# Patient Record
Sex: Male | Born: 1965 | Race: White | Hispanic: No | Smoking: Current some day smoker
Health system: Southern US, Community
[De-identification: ages and names within clinical notes are randomized; demographics above are authoritative.]

## PROBLEM LIST (undated history)

## (undated) ENCOUNTER — Emergency Department (HOSPITAL_COMMUNITY): Discharge status: Against Medical Advice | Payer: MEDICAID

## (undated) DIAGNOSIS — F1110 Opioid abuse, uncomplicated: Secondary | ICD-10-CM

## (undated) DIAGNOSIS — F32A Depression, unspecified: Secondary | ICD-10-CM

## (undated) DIAGNOSIS — F329 Major depressive disorder, single episode, unspecified: Secondary | ICD-10-CM

## (undated) DIAGNOSIS — G8929 Other chronic pain: Secondary | ICD-10-CM

## (undated) DIAGNOSIS — M549 Dorsalgia, unspecified: Secondary | ICD-10-CM

## (undated) DIAGNOSIS — B1920 Unspecified viral hepatitis C without hepatic coma: Secondary | ICD-10-CM

## (undated) HISTORY — PX: SALIVARY GLAND SURGERY: SHX768

## (undated) HISTORY — PX: BACK SURGERY: SHX140

## (undated) HISTORY — PX: SHOULDER SURGERY: SHX246

---

## 1997-07-11 ENCOUNTER — Ambulatory Visit (HOSPITAL_COMMUNITY): Admission: RE | Admit: 1997-07-11 | Discharge: 1997-07-11 | Payer: Self-pay | Admitting: Neurosurgery

## 2000-11-05 ENCOUNTER — Emergency Department (HOSPITAL_COMMUNITY): Admission: EM | Admit: 2000-11-05 | Discharge: 2000-11-05 | Payer: Self-pay | Admitting: Emergency Medicine

## 2001-06-06 ENCOUNTER — Emergency Department (HOSPITAL_COMMUNITY): Admission: EM | Admit: 2001-06-06 | Discharge: 2001-06-06 | Payer: Self-pay | Admitting: Emergency Medicine

## 2001-06-06 ENCOUNTER — Encounter: Payer: Self-pay | Admitting: Emergency Medicine

## 2001-06-07 ENCOUNTER — Emergency Department (HOSPITAL_COMMUNITY): Admission: EM | Admit: 2001-06-07 | Discharge: 2001-06-07 | Payer: Self-pay | Admitting: Emergency Medicine

## 2003-01-13 ENCOUNTER — Emergency Department (HOSPITAL_COMMUNITY): Admission: EM | Admit: 2003-01-13 | Discharge: 2003-01-13 | Payer: Self-pay | Admitting: Emergency Medicine

## 2003-01-13 ENCOUNTER — Encounter: Payer: Self-pay | Admitting: Emergency Medicine

## 2003-01-17 ENCOUNTER — Encounter: Payer: Self-pay | Admitting: Orthopedic Surgery

## 2003-01-17 ENCOUNTER — Ambulatory Visit (HOSPITAL_COMMUNITY): Admission: RE | Admit: 2003-01-17 | Discharge: 2003-01-17 | Payer: Self-pay | Admitting: Orthopedic Surgery

## 2003-03-08 ENCOUNTER — Emergency Department (HOSPITAL_COMMUNITY): Admission: EM | Admit: 2003-03-08 | Discharge: 2003-03-08 | Payer: Self-pay | Admitting: Emergency Medicine

## 2006-12-31 ENCOUNTER — Emergency Department (HOSPITAL_COMMUNITY): Admission: EM | Admit: 2006-12-31 | Discharge: 2006-12-31 | Payer: Self-pay | Admitting: Emergency Medicine

## 2007-01-20 ENCOUNTER — Ambulatory Visit: Payer: Self-pay | Admitting: Internal Medicine

## 2007-07-26 ENCOUNTER — Ambulatory Visit: Payer: Self-pay | Admitting: Internal Medicine

## 2007-08-04 ENCOUNTER — Ambulatory Visit: Payer: Self-pay | Admitting: Internal Medicine

## 2007-08-04 LAB — CONVERTED CEMR LAB
ALT: 92 units/L — ABNORMAL HIGH (ref 0–53)
AST: 32 units/L (ref 0–37)
Albumin: 4 g/dL (ref 3.5–5.2)
Alkaline Phosphatase: 88 units/L (ref 39–117)
BUN: 12 mg/dL (ref 6–23)
CO2: 22 meq/L (ref 19–32)
Calcium: 8.9 mg/dL (ref 8.4–10.5)
Chloride: 107 meq/L (ref 96–112)
Cholesterol: 202 mg/dL — ABNORMAL HIGH (ref 0–200)
Creatinine, Ser: 0.94 mg/dL (ref 0.40–1.50)
Glucose, Bld: 101 mg/dL — ABNORMAL HIGH (ref 70–99)
HDL: 24 mg/dL — ABNORMAL LOW (ref >39)
Potassium: 4.5 meq/L (ref 3.5–5.3)
Sodium: 141 meq/L (ref 135–145)
Total Bilirubin: 0.4 mg/dL (ref 0.3–1.2)
Total CHOL/HDL Ratio: 8.4
Total Protein: 7.7 g/dL (ref 6.0–8.3)
Triglycerides: 425 mg/dL — ABNORMAL HIGH (ref <150)

## 2007-08-05 ENCOUNTER — Ambulatory Visit: Payer: Self-pay | Admitting: *Deleted

## 2007-09-01 ENCOUNTER — Ambulatory Visit: Payer: Self-pay | Admitting: Internal Medicine

## 2007-09-01 LAB — CONVERTED CEMR LAB
HCV Ab: POSITIVE — AB
Hep A Total Ab: NEGATIVE
Hep B Core Total Ab: NEGATIVE
Hep B E Ab: NEGATIVE
Hep B S Ab: NEGATIVE
Hepatitis B Surface Ag: NEGATIVE

## 2007-09-19 ENCOUNTER — Ambulatory Visit: Payer: Self-pay | Admitting: Internal Medicine

## 2008-02-16 ENCOUNTER — Ambulatory Visit: Payer: Self-pay | Admitting: Internal Medicine

## 2009-07-05 ENCOUNTER — Ambulatory Visit: Payer: Self-pay | Admitting: Internal Medicine

## 2009-07-05 LAB — CONVERTED CEMR LAB
ALT: 245 units/L — ABNORMAL HIGH (ref 0–53)
AST: 90 units/L — ABNORMAL HIGH (ref 0–37)
Albumin: 4.6 g/dL (ref 3.5–5.2)
Alkaline Phosphatase: 83 units/L (ref 39–117)
BUN: 10 mg/dL (ref 6–23)
Basophils Absolute: 0.1 10*3/uL (ref 0.0–0.1)
Basophils Relative: 1 % (ref 0–1)
CO2: 25 meq/L (ref 19–32)
Calcium: 9.6 mg/dL (ref 8.4–10.5)
Chloride: 106 meq/L (ref 96–112)
Cholesterol: 212 mg/dL — ABNORMAL HIGH (ref 0–200)
Creatinine, Ser: 0.95 mg/dL (ref 0.40–1.50)
Direct LDL: 118 mg/dL — ABNORMAL HIGH
Eosinophils Absolute: 0.5 10*3/uL (ref 0.0–0.7)
Eosinophils Relative: 6 % — ABNORMAL HIGH (ref 0–5)
Glucose, Bld: 83 mg/dL (ref 70–99)
HCT: 49.5 % (ref 39.0–52.0)
HDL: 25 mg/dL — ABNORMAL LOW (ref >39)
Hemoglobin: 16.9 g/dL (ref 13.0–17.0)
Lymphocytes Relative: 26 % (ref 12–46)
Lymphs Abs: 2.2 10*3/uL (ref 0.7–4.0)
MCHC: 34.1 g/dL (ref 30.0–36.0)
MCV: 91.8 fL (ref 78.0–100.0)
Monocytes Absolute: 0.7 10*3/uL (ref 0.1–1.0)
Monocytes Relative: 8 % (ref 3–12)
Neutro Abs: 5.2 10*3/uL (ref 1.7–7.7)
Neutrophils Relative %: 60 % (ref 43–77)
Platelets: 191 10*3/uL (ref 150–400)
Potassium: 4.2 meq/L (ref 3.5–5.3)
RBC: 5.39 M/uL (ref 4.22–5.81)
RDW: 13.6 % (ref 11.5–15.5)
Sodium: 139 meq/L (ref 135–145)
Total Bilirubin: 0.5 mg/dL (ref 0.3–1.2)
Total CHOL/HDL Ratio: 8.5
Total Protein: 8 g/dL (ref 6.0–8.3)
Triglycerides: 428 mg/dL — ABNORMAL HIGH (ref <150)
WBC: 8.7 10*3/uL (ref 4.0–10.5)

## 2010-03-07 ENCOUNTER — Emergency Department (HOSPITAL_BASED_OUTPATIENT_CLINIC_OR_DEPARTMENT_OTHER)
Admission: EM | Admit: 2010-03-07 | Discharge: 2010-03-07 | Payer: Self-pay | Source: Home / Self Care | Admitting: Emergency Medicine

## 2010-06-20 ENCOUNTER — Emergency Department (HOSPITAL_BASED_OUTPATIENT_CLINIC_OR_DEPARTMENT_OTHER)
Admission: EM | Admit: 2010-06-20 | Discharge: 2010-06-20 | Disposition: A | Discharge status: Home / Self Care | Payer: Self-pay | Attending: Emergency Medicine | Admitting: Emergency Medicine

## 2010-06-20 DIAGNOSIS — R220 Localized swelling, mass and lump, head: Secondary | ICD-10-CM | POA: Insufficient documentation

## 2010-06-20 DIAGNOSIS — F17200 Nicotine dependence, unspecified, uncomplicated: Secondary | ICD-10-CM | POA: Insufficient documentation

## 2010-06-20 DIAGNOSIS — F1910 Other psychoactive substance abuse, uncomplicated: Secondary | ICD-10-CM | POA: Insufficient documentation

## 2010-06-20 DIAGNOSIS — T783XXA Angioneurotic edema, initial encounter: Secondary | ICD-10-CM | POA: Insufficient documentation

## 2010-06-20 DIAGNOSIS — X58XXXA Exposure to other specified factors, initial encounter: Secondary | ICD-10-CM | POA: Insufficient documentation

## 2010-06-20 LAB — COMPREHENSIVE METABOLIC PANEL
Albumin: 4.3 g/dL (ref 3.5–5.2)
Alkaline Phosphatase: 104 U/L (ref 39–117)
BUN: 8 mg/dL (ref 6–23)
Chloride: 110 mEq/L (ref 96–112)
Creatinine, Ser: 0.8 mg/dL (ref 0.4–1.5)
Glucose, Bld: 137 mg/dL — ABNORMAL HIGH (ref 70–99)
Potassium: 4 mEq/L (ref 3.5–5.1)
Total Bilirubin: 0.8 mg/dL (ref 0.3–1.2)

## 2010-06-20 LAB — CBC
HCT: 45 % (ref 39.0–52.0)
MCH: 30.9 pg (ref 26.0–34.0)
MCV: 85.7 fL (ref 78.0–100.0)
Platelets: 165 10*3/uL (ref 150–400)
RBC: 5.25 MIL/uL (ref 4.22–5.81)
RDW: 13 % (ref 11.5–15.5)
WBC: 11.1 10*3/uL — ABNORMAL HIGH (ref 4.0–10.5)

## 2010-06-20 LAB — DIFFERENTIAL
Basophils Absolute: 0 10*3/uL (ref 0.0–0.1)
Basophils Relative: 0 % (ref 0–1)
Eosinophils Absolute: 0.2 10*3/uL (ref 0.0–0.7)
Eosinophils Relative: 2 % (ref 0–5)
Lymphocytes Relative: 14 % (ref 12–46)
Lymphs Abs: 1.5 10*3/uL (ref 0.7–4.0)
Monocytes Absolute: 0.5 10*3/uL (ref 0.1–1.0)
Monocytes Relative: 4 % (ref 3–12)
Neutro Abs: 8.8 10*3/uL — ABNORMAL HIGH (ref 1.7–7.7)
Neutrophils Relative %: 80 % — ABNORMAL HIGH (ref 43–77)

## 2010-08-22 NOTE — Op Note (Signed)
   NAME:  Erik, [name]                           ACCOUNT NO.:  [id number]   MEDICAL RECORD NO.:  [id number]                   PATIENT TYPE:  OIB   LOCATION:  2550                                 FACILITY:  [facility]   PHYSICIAN:  Myrtie Neither, M.D.                 DATE OF BIRTH:  [birth year]   DATE OF PROCEDURE:  01/17/2003  DATE OF DISCHARGE:                                 OPERATIVE REPORT   PREOPERATIVE DIAGNOSIS:  Fractured fourth and fifth metacarpals right hand.   POSTOPERATIVE DIAGNOSIS:  Fractured fourth and fifth metacarpals right hand.   ANESTHESIA:  General.   PROCEDURE:  Closed manipulative reduction percutaneous pinning fourth and  fifth metacarpals right hand.   The patient was take to the operating room after given adequate preoperative  medication, given general anesthesia and intubated.  The right hand was  scrubbed with Betadine scrub and painted with Duraprep, draped in a sterile  manner.  The Bovie was used for hemostasis.  C-arm used to visualized  manipulative reduction.  A small incision was made over the fifth metacarpal  base, visualized with the C-arm.  Traction was applied to the fifth finger.  Reduction of the fifth metacarpal was done and it was pinned with a K wire.  The fourth metacarpal:  A small incision was made over the base of the  fourth metacarpal.  Again, manipulative reduction was done and stabilized  with a K wire.  Irrigation was then done.  Wound closure was done with 4-0  nylon.  Bulky compressive dressing was applied and bulky cast applied.  The  patient tolerated the procedure quite well, went to the recovery room in  stable and satisfactory condition.  The patient is being discharged home,  Percocet one to two q.4h. p.r.n. for pain, ice packs, elevation, and return  to the office in one week.  The patient is being discharged in stable and  satisfactory condition.                                               Myrtie Neither, M.D.    AC/MEDQ  D:  01/17/2003  T:  01/17/2003  Job:  [id number]
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
## 2010-09-15 ENCOUNTER — Emergency Department (INDEPENDENT_AMBULATORY_CARE_PROVIDER_SITE_OTHER): Payer: Self-pay

## 2010-09-15 ENCOUNTER — Emergency Department (HOSPITAL_BASED_OUTPATIENT_CLINIC_OR_DEPARTMENT_OTHER)
Admission: EM | Admit: 2010-09-15 | Discharge: 2010-09-15 | Disposition: A | Discharge status: Home / Self Care | Payer: Self-pay | Attending: Emergency Medicine | Admitting: Emergency Medicine

## 2010-09-15 DIAGNOSIS — R0602 Shortness of breath: Secondary | ICD-10-CM | POA: Insufficient documentation

## 2010-09-15 DIAGNOSIS — J069 Acute upper respiratory infection, unspecified: Secondary | ICD-10-CM | POA: Insufficient documentation

## 2010-09-15 DIAGNOSIS — J90 Pleural effusion, not elsewhere classified: Secondary | ICD-10-CM

## 2010-09-15 DIAGNOSIS — F17200 Nicotine dependence, unspecified, uncomplicated: Secondary | ICD-10-CM | POA: Insufficient documentation

## 2010-09-15 DIAGNOSIS — B1920 Unspecified viral hepatitis C without hepatic coma: Secondary | ICD-10-CM | POA: Insufficient documentation

## 2010-09-15 DIAGNOSIS — R079 Chest pain, unspecified: Secondary | ICD-10-CM | POA: Insufficient documentation

## 2010-09-15 DIAGNOSIS — I517 Cardiomegaly: Secondary | ICD-10-CM

## 2010-09-15 DIAGNOSIS — I319 Disease of pericardium, unspecified: Secondary | ICD-10-CM

## 2010-09-15 LAB — URINALYSIS, ROUTINE W REFLEX MICROSCOPIC
Glucose, UA: NEGATIVE mg/dL
Leukocytes, UA: NEGATIVE
Nitrite: NEGATIVE
Protein, ur: NEGATIVE mg/dL
pH: 6.5 (ref 5.0–8.0)

## 2010-09-15 LAB — TROPONIN I
Troponin I: <0.3 ng/mL (ref <0.30)
Troponin I: <0.3 ng/mL (ref <0.30)

## 2010-09-15 LAB — BASIC METABOLIC PANEL
CO2: 24 mEq/L (ref 19–32)
Chloride: 103 mEq/L (ref 96–112)
GFR calc Af Amer: >60 mL/min (ref >60)
Sodium: 138 mEq/L (ref 135–145)

## 2010-09-15 LAB — DIFFERENTIAL
Lymphocytes Relative: 15 % (ref 12–46)
Lymphs Abs: 1.5 10*3/uL (ref 0.7–4.0)
Monocytes Absolute: 0.8 10*3/uL (ref 0.1–1.0)
Monocytes Relative: 8 % (ref 3–12)
Neutro Abs: 7.3 10*3/uL (ref 1.7–7.7)

## 2010-09-15 LAB — CBC
HCT: 43 % (ref 39.0–52.0)
Hemoglobin: 14.9 g/dL (ref 13.0–17.0)
MCH: 29.9 pg (ref 26.0–34.0)
MCHC: 34.7 g/dL (ref 30.0–36.0)
MCV: 86.3 fL (ref 78.0–100.0)

## 2010-09-15 LAB — CK TOTAL AND CKMB (NOT AT ARMC): Total CK: 71 U/L (ref 7–232)

## 2010-09-15 MED ORDER — IOHEXOL 350 MG/ML SOLN
80.0000 mL | Freq: Once | INTRAVENOUS | Status: AC | PRN
  Administered 2010-09-15: 80 mL via INTRAVENOUS

## 2011-06-24 ENCOUNTER — Other Ambulatory Visit: Payer: Self-pay | Admitting: Family Medicine

## 2011-06-24 DIAGNOSIS — R748 Abnormal levels of other serum enzymes: Secondary | ICD-10-CM

## 2011-06-29 ENCOUNTER — Ambulatory Visit
Admission: RE | Admit: 2011-06-29 | Discharge: 2011-06-29 | Disposition: A | Discharge status: Home / Self Care | Payer: No Typology Code available for payment source | Source: Ambulatory Visit | Attending: Family Medicine | Admitting: Family Medicine

## 2011-06-29 DIAGNOSIS — R748 Abnormal levels of other serum enzymes: Secondary | ICD-10-CM

## 2011-09-30 ENCOUNTER — Emergency Department (HOSPITAL_BASED_OUTPATIENT_CLINIC_OR_DEPARTMENT_OTHER): Payer: Self-pay

## 2011-09-30 ENCOUNTER — Encounter (HOSPITAL_BASED_OUTPATIENT_CLINIC_OR_DEPARTMENT_OTHER): Payer: Self-pay | Admitting: Emergency Medicine

## 2011-09-30 ENCOUNTER — Emergency Department (HOSPITAL_BASED_OUTPATIENT_CLINIC_OR_DEPARTMENT_OTHER)
Admission: EM | Admit: 2011-09-30 | Discharge: 2011-09-30 | Disposition: A | Discharge status: Home / Self Care | Payer: Self-pay | Attending: Emergency Medicine | Admitting: Emergency Medicine

## 2011-09-30 DIAGNOSIS — S022XXA Fracture of nasal bones, initial encounter for closed fracture: Secondary | ICD-10-CM | POA: Insufficient documentation

## 2011-09-30 DIAGNOSIS — S02401A Maxillary fracture, unspecified, initial encounter for closed fracture: Secondary | ICD-10-CM | POA: Insufficient documentation

## 2011-09-30 DIAGNOSIS — S0120XA Unspecified open wound of nose, initial encounter: Secondary | ICD-10-CM | POA: Insufficient documentation

## 2011-09-30 DIAGNOSIS — S0230XA Fracture of orbital floor, unspecified side, initial encounter for closed fracture: Secondary | ICD-10-CM | POA: Insufficient documentation

## 2011-09-30 DIAGNOSIS — S0121XA Laceration without foreign body of nose, initial encounter: Secondary | ICD-10-CM

## 2011-09-30 MED ORDER — MORPHINE SULFATE 4 MG/ML IJ SOLN
6.0000 mg | Freq: Once | INTRAMUSCULAR | Status: AC
  Administered 2011-09-30: 6 mg via INTRAMUSCULAR
  Filled 2011-09-30: qty 1

## 2011-09-30 MED ORDER — MORPHINE SULFATE 2 MG/ML IJ SOLN
INTRAMUSCULAR | Status: AC
  Filled 2011-09-30: qty 1

## 2011-09-30 MED ORDER — LIDOCAINE-EPINEPHRINE 2 %-1:100000 IJ SOLN
30.0000 mL | Freq: Once | INTRAMUSCULAR | Status: AC
  Administered 2011-09-30: 30 mL
  Filled 2011-09-30: qty 1

## 2011-09-30 MED ORDER — OXYCODONE HCL 5 MG PO TABS: 5.0000 mg | ORAL_TABLET | ORAL | Status: AC | PRN

## 2011-09-30 MED ORDER — OXYCODONE-ACETAMINOPHEN 5-325 MG PO TABS
2.0000 | ORAL_TABLET | Freq: Once | ORAL | Status: AC
  Administered 2011-09-30: 2 via ORAL
  Filled 2011-09-30: qty 2
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
## 2011-09-30 NOTE — ED Notes (Signed)
Pt was brought to ED by father.

## 2011-09-30 NOTE — Discharge Instructions (Signed)
Facial Laceration  A facial laceration is a cut on the face. Lacerations usually heal quickly, but they need special care to reduce scarring. It will take 1 to 2 years for the scar to lose its redness and to heal completely.  TREATMENT   Some facial lacerations may not require closure. Some lacerations may not be able to be closed due to an increased risk of infection. It is important to see your caregiver as soon as possible after an injury to minimize the risk of infection and to maximize the opportunity for successful closure.  If closure is appropriate, pain medicines may be given, if needed. The wound will be cleaned to help prevent infection. Your caregiver will use stitches (sutures), staples, wound glue (adhesive), or skin adhesive strips to repair the laceration. These tools bring the skin edges together to allow for faster healing and a better cosmetic outcome. However, all wounds will heal with a scar.   Once the wound has healed, scarring can be minimized by covering the wound with sunscreen during the day for 1 full year. Use a sunscreen with an SPF of at least 30. Sunscreen helps to reduce the pigment that will form in the scar. When applying sunscreen to a completely healed wound, massage the scar for a few minutes to help reduce the appearance of the scar. Use circular motions with your fingertips, on and around the scar. Do not massage a healing wound.  HOME CARE INSTRUCTIONS  For sutures:   Keep the wound clean and dry.   If you were given a bandage (dressing), you should change it at least once a day. Also change the dressing if it becomes wet or dirty, or as directed by your caregiver.   Wash the wound with soap and water 2 times a day. Rinse the wound off with water to remove all soap. Pat the wound dry with a clean towel.   After cleaning, apply a thin layer of the antibiotic ointment recommended by your caregiver. This will help prevent infection and keep the dressing from sticking.   You  may shower as usual after the first 24 hours. Do not soak the wound in water until the sutures are removed.   Only take over-the-counter or prescription medicines for pain, discomfort, or fever as directed by your caregiver.   Get your sutures removed as directed by your caregiver. With facial lacerations, sutures should usually be taken out after 4 to 5 days to avoid stitch marks.   Wait a few days after your sutures are removed before applying makeup.  For skin adhesive strips:   Keep the wound clean and dry.   Do not get the skin adhesive strips wet. You may bathe carefully, using caution to keep the wound dry.   If the wound gets wet, pat it dry with a clean towel.   Skin adhesive strips will fall off on their own. You may trim the strips as the wound heals. Do not remove skin adhesive strips that are still stuck to the wound. They will fall off in time.  For wound adhesive:   You may briefly wet your wound in the shower or bath. Do not soak or scrub the wound. Do not swim. Avoid periods of heavy perspiration until the skin adhesive has fallen off on its own. After showering or bathing, gently pat the wound dry with a clean towel.   Do not apply liquid medicine, cream medicine, ointment medicine, or makeup to your wound while the   before your wound is healed.   If a dressing is placed over the wound, be careful not to apply tape directly over the skin adhesive. This may cause the adhesive to be pulled off before the wound is healed.   Avoid prolonged exposure to sunlight or tanning lamps while the skin adhesive is in place. Exposure to ultraviolet light in the first year will darken the scar.   The skin adhesive will usually remain in place for 5 to 10 days, then naturally fall off the skin. Do not pick at the adhesive film.  You may need a tetanus shot if:  You cannot remember when you had your last tetanus shot.   You have  never had a tetanus shot.  If you get a tetanus shot, your arm may swell, get red, and feel warm to the touch. This is common and not a problem. If you need a tetanus shot and you choose not to have one, there is a rare chance of getting tetanus. Sickness from tetanus can be serious. SEEK IMMEDIATE MEDICAL CARE IF:  You develop redness, pain, or swelling around the wound.   There is yellowish-white fluid (pus) coming from the wound.   You develop chills or a fever.  MAKE SURE YOU:  Understand these instructions.   Will watch your condition.   Will get help right away if you are not doing well or get worse.  Document Released: 04/30/2004 Document Revised: 03/12/2011 Document Reviewed: 09/15/2010 [facility] Patient Information 2012 [place], [place].Nasal Fracture A nasal fracture is a break or crack in the bones of the nose. A minor break usually heals in a month. You often will receive black eyes from a nasal fracture. This is not a cause for concern. The black eyes will go away over 1 to 2 weeks.  DIAGNOSIS  Your caregiver may want to examine you if you are concerned about a fracture of the nose. X-rays of the nose may not show a nasal fracture even when one is present. Sometimes your caregiver must wait 1 to 5 days after the injury to re-check the nose for alignment and to take additional X-rays. Sometimes the caregiver must wait until the swelling has gone down. TREATMENT Minor fractures that have caused no deformity often do not require treatment. More serious fractures where bones are displaced may require surgery. This will take place after the swelling is gone. Surgery will stabilize and align the fracture. HOME CARE INSTRUCTIONS   Put ice on the injured area.   Put ice in a plastic bag.   Place a towel between your skin and the bag.   Leave the ice on for 15 to 20 minutes, 3 to 4 times a day.   Take medications as directed by your caregiver.   Only take over-the-counter or  prescription medicines for pain, discomfort, or fever as directed by your caregiver.   If your nose starts bleeding, squeeze the soft parts of the nose against the center wall while you are sitting in an upright position for 10 minutes.   Contact sports should be avoided for at least 3 to 4 weeks or as directed by your caregiver.  SEEK MEDICAL CARE IF:  Your pain increases or becomes severe.   You continue to have nosebleeds.   The shape of your nose does not return to normal within 5 days.   You have pus draining from the nose.  SEEK IMMEDIATE MEDICAL CARE IF:   You have bleeding from your nose that  does not stop after 20 minutes of pinching the nostrils closed and keeping ice on the nose.   You have clear fluid draining from your nose.   You notice a grape-like swelling on the dividing wall between the nostrils (septum). This is a collection of blood (hematoma) that must be drained to help prevent infection.   You have difficulty moving your eyes.   You have recurrent vomiting.  Document Released: 03/20/2000 Document Revised: 03/12/2011 Document Reviewed: 07/07/2010 [facility] Patient Information 2012 [place], [place].Orbital Floor Fracture, Blowout The eye sits in the bony structure of the skull called the orbit. The upper and outside walls of the orbit are very thick and strong. These walls protect the eye if the head is struck from the top or side of the eye. However, the inside wall near the nose and the orbit floor are very thin and weak. The bony floor of the orbit also acts as the roof of the air-filled space (sinus) below the orbit. If the eye receives a direct blow from the front, all the tissues around the eye are briefly pressed together. This makes the orbital wall pressure very high. Since the weakest walls tend to give way first, the inside wall or the orbit floor may break. If the floor fractures, the tissues around the eye, including the muscle that is used to make the eye  look down, may become trapped within the fracture as the floor of the orbit "blows out" into the sinus below.  CAUSES  Orbital floor fractures are caused by direct (blunt) trauma to the region of the eye. SYMPTOMS  Assuming that there has been no injury to the eye itself, symptoms can include:  Puffiness (swelling) and bruising around the eye area (black eye).   A gurgling sound when pressure is placed on the eye area. This sound comes from air that has escaped from the sinus into the space around the eye (orbital emphysema).   Seeing two of everything - one object being higher than the other (vertical diplopia). This is the result of the muscle that moves the eye down being trapped within the fracture. Since it cannot relax, the eye is being held in a downward position relative to the other eye and cannot look up. Vertical diplopia from an orbital floor fracture is worse when looking up.   Pain around the eye when looking up.   One eye looks sunken compared to the other eye (enophthalmos).   Numbness of the cheek and upper gum on the same side of the face with the floor fracture. This is a result of nerve injury to these areas. This nerve runs in a groove along the bone of the orbital floor on its way to the cheek and upper gums.  DIAGNOSIS  The diagnosis of an orbital floor fracture is suspected during an eye exam by an ophthalmologist. It is confirmed by X-rays or CT scan of the eye region. TREATMENT   Orbital floor fractures are not usually treated until all of the swelling around the eye has gone away. This may take 1 or 2 weeks. Once the swelling has gone down, an ophthalmologist will see if if the muscle below the eye is still trapped within the fracture.   If there is no sign of a trapped muscle or vertical diplopia, treatment is not necessary.   If there is double vision only when looking up, a decision may be made to not do anything since most people do not spend a lot  of time  looking up. This may depend on the person's profession. For instance, a Nutritional therapist or electrician may spend a large part of their day looking up and would therefore need treatment.   If there is persistent vertical double vision even when looking straight ahead, the ophthalmologist may try to free the muscle in the office. If this is unsuccessful, surgery is often needed.  SEEK IMMEDIATE MEDICAL CARE IF:  You have had a blow to the region of your eyes and have:  A drop in vision in either eye.   Swelling and bruising around either eye.   One eye seems to be "sunken" compared to the other.   You see two of everything with both eyes open when looking in any direction.   The two images get further apart when looking in a certain direction - especially up.   You have numbness of the cheek and upper gums on the side of the injury.   You develop an unexplained oral temperature over 102 F (38.9 C), or as your caregiver suggests.  Document Released: 09/16/2000 Document Revised: 03/12/2011 Document Reviewed: 07/18/2007 [facility] Patient Information 2012 [place], [place].

## 2011-09-30 NOTE — ED Notes (Signed)
Patient transported to CT 
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
## 2012-01-07 ENCOUNTER — Other Ambulatory Visit: Payer: Self-pay | Admitting: Neurosurgery

## 2012-01-07 DIAGNOSIS — M5126 Other intervertebral disc displacement, lumbar region: Secondary | ICD-10-CM

## 2012-01-07 DIAGNOSIS — M48061 Spinal stenosis, lumbar region without neurogenic claudication: Secondary | ICD-10-CM

## 2012-01-12 ENCOUNTER — Other Ambulatory Visit: Payer: Self-pay | Admitting: Neurosurgery

## 2012-01-12 ENCOUNTER — Ambulatory Visit
Admission: RE | Admit: 2012-01-12 | Discharge: 2012-01-12 | Disposition: A | Discharge status: Home / Self Care | Payer: No Typology Code available for payment source | Source: Ambulatory Visit | Attending: Neurosurgery | Admitting: Neurosurgery

## 2012-01-12 VITALS — BP 154/97 | HR 70

## 2012-01-12 DIAGNOSIS — M5126 Other intervertebral disc displacement, lumbar region: Secondary | ICD-10-CM

## 2012-01-12 DIAGNOSIS — M48061 Spinal stenosis, lumbar region without neurogenic claudication: Secondary | ICD-10-CM

## 2012-01-12 MED ORDER — IOHEXOL 180 MG/ML  SOLN
1.0000 mL | Freq: Once | INTRAMUSCULAR | Status: AC | PRN
  Administered 2012-01-12: 1 mL via EPIDURAL

## 2012-01-12 MED ORDER — METHYLPREDNISOLONE ACETATE 40 MG/ML INJ SUSP (RADIOLOG
120.0000 mg | Freq: Once | INTRAMUSCULAR | Status: AC
  Administered 2012-01-12: 120 mg via EPIDURAL

## 2012-10-20 ENCOUNTER — Emergency Department (HOSPITAL_BASED_OUTPATIENT_CLINIC_OR_DEPARTMENT_OTHER): Payer: Self-pay

## 2012-10-20 ENCOUNTER — Emergency Department (HOSPITAL_BASED_OUTPATIENT_CLINIC_OR_DEPARTMENT_OTHER)
Admission: EM | Admit: 2012-10-20 | Discharge: 2012-10-20 | Disposition: A | Discharge status: Home / Self Care | Payer: Self-pay | Attending: Emergency Medicine | Admitting: Emergency Medicine

## 2012-10-20 ENCOUNTER — Encounter (HOSPITAL_BASED_OUTPATIENT_CLINIC_OR_DEPARTMENT_OTHER): Payer: Self-pay

## 2012-10-20 DIAGNOSIS — Z8659 Personal history of other mental and behavioral disorders: Secondary | ICD-10-CM | POA: Insufficient documentation

## 2012-10-20 DIAGNOSIS — S6991XA Unspecified injury of right wrist, hand and finger(s), initial encounter: Secondary | ICD-10-CM

## 2012-10-20 DIAGNOSIS — W268XXA Contact with other sharp object(s), not elsewhere classified, initial encounter: Secondary | ICD-10-CM | POA: Insufficient documentation

## 2012-10-20 DIAGNOSIS — M549 Dorsalgia, unspecified: Secondary | ICD-10-CM | POA: Insufficient documentation

## 2012-10-20 DIAGNOSIS — Y929 Unspecified place or not applicable: Secondary | ICD-10-CM | POA: Insufficient documentation

## 2012-10-20 DIAGNOSIS — S40919A Unspecified superficial injury of unspecified shoulder, initial encounter: Secondary | ICD-10-CM | POA: Insufficient documentation

## 2012-10-20 DIAGNOSIS — Y939 Activity, unspecified: Secondary | ICD-10-CM | POA: Insufficient documentation

## 2012-10-20 DIAGNOSIS — F17200 Nicotine dependence, unspecified, uncomplicated: Secondary | ICD-10-CM | POA: Insufficient documentation

## 2012-10-20 HISTORY — DX: Other chronic pain: G89.29

## 2012-10-20 HISTORY — DX: Major depressive disorder, single episode, unspecified: F32.9

## 2012-10-20 HISTORY — DX: Depression, unspecified: F32.A

## 2012-10-20 HISTORY — DX: Dorsalgia, unspecified: M54.9

## 2012-10-20 MED ORDER — SULFAMETHOXAZOLE-TRIMETHOPRIM 800-160 MG PO TABS: 1.0000 | ORAL_TABLET | Freq: Two times a day (BID) | ORAL | Status: AC

## 2012-10-20 MED ORDER — LIDOCAINE HCL 2 % IJ SOLN: 5.0000 mL | Freq: Once | INTRAMUSCULAR | Status: DC

## 2012-10-20 NOTE — ED Provider Notes (Signed)
Medical screening examination/treatment/procedure(s) were performed by non-physician practitioner and as supervising physician I was immediately available for consultation/collaboration.   Carlen Rebuck, MD 10/20/12 1557 

## 2012-10-20 NOTE — ED Provider Notes (Signed)
   History    CSN: [id number] Arrival date & time 10/20/12  1143  None    Chief Complaint  Patient presents with  . Foreign Body   (Consider location/radiation/quality/duration/timing/severity/associated sxs/prior Treatment) Patient is a 47 y.o. male presenting with foreign body. The history is provided by the patient. No language interpreter was used.  Foreign Body Location:  Skin Suspected object: fishhook. Pain severity:  Mild Duration:  14 hours Timing:  Constant Ineffective treatments:  Removal attempts with tweezers  Past Medical History  Diagnosis Date  . Chronic back pain   . Depression    Past Surgical History  Procedure Laterality Date  . Back surgery    . Shoulder surgery    . Salivary gland surgery     No family history on file. History  Substance Use Topics  . Smoking status: Current Some Day Smoker  . Smokeless tobacco: Not on file  . Alcohol Use: Yes     Comment: 2 beers weekly    Review of Systems  All other systems reviewed and are negative.    Allergies  Review of patient's allergies indicates no known allergies.  Home Medications  No current outpatient prescriptions on file. BP 129/85  Pulse 91  Temp(Src) 98.3 F (36.8 C) (Oral)  Resp 18  Ht 6' (1.829 m)  Wt 300 lb (136.079 kg)  BMI 40.68 kg/m2  SpO2 97% Physical Exam  Constitutional: He appears well-developed and well-nourished.  HENT:  Head: Normocephalic.  Musculoskeletal: He exhibits tenderness.  Fish hook end of right thumb  nv and ns intact  Neurological: He is alert.  Skin: Skin is warm.  Psychiatric: He has a normal mood and affect.    ED Course  FOREIGN BODY REMOVAL Date/Time: 10/20/2012 12:27 PM Performed by: Elson Areas Authorized by: Elson Areas Consent: Verbal consent obtained. Consent given by: patient Patient understanding: patient states understanding of the procedure being performed Patient identity confirmed: verbally with patient Time out:  Immediately prior to procedure a "time out" was called to verify the correct patient, procedure, equipment, support staff and site/side marked as required. Body area: skin Anesthesia: local infiltration Local anesthetic: lidocaine 2% without epinephrine Patient sedated: no 1 objects recovered. Objects recovered: fish hook Post-procedure assessment: foreign body removed Patient tolerance: Patient tolerated the procedure well with no immediate complications. Comments: Small incision over barb to remove    (including critical care time) Labs Reviewed - No data to display No results found. 1. Fish hook injury of finger of right hand     MDM  Tetanus up to date,   Rx for bactrim x 7 days  Elson Areas, PA-C 10/20/12 1232

## 2012-10-20 NOTE — ED Notes (Signed)
Fish hook in right thumb

## 2012-10-20 NOTE — ED Notes (Signed)
Patient transported to [facility] 

## 2013-03-24 ENCOUNTER — Encounter (HOSPITAL_BASED_OUTPATIENT_CLINIC_OR_DEPARTMENT_OTHER): Payer: Self-pay | Admitting: Emergency Medicine

## 2013-03-24 ENCOUNTER — Emergency Department (HOSPITAL_BASED_OUTPATIENT_CLINIC_OR_DEPARTMENT_OTHER): Payer: Self-pay

## 2013-03-24 ENCOUNTER — Emergency Department (HOSPITAL_BASED_OUTPATIENT_CLINIC_OR_DEPARTMENT_OTHER)
Admission: EM | Admit: 2013-03-24 | Discharge: 2013-03-24 | Disposition: A | Discharge status: Home / Self Care | Payer: Self-pay | Attending: Emergency Medicine | Admitting: Emergency Medicine

## 2013-03-24 DIAGNOSIS — R109 Unspecified abdominal pain: Secondary | ICD-10-CM

## 2013-03-24 DIAGNOSIS — F17200 Nicotine dependence, unspecified, uncomplicated: Secondary | ICD-10-CM | POA: Insufficient documentation

## 2013-03-24 DIAGNOSIS — Z8659 Personal history of other mental and behavioral disorders: Secondary | ICD-10-CM | POA: Insufficient documentation

## 2013-03-24 DIAGNOSIS — R1013 Epigastric pain: Secondary | ICD-10-CM | POA: Insufficient documentation

## 2013-03-24 DIAGNOSIS — G8929 Other chronic pain: Secondary | ICD-10-CM | POA: Insufficient documentation

## 2013-03-24 DIAGNOSIS — R1012 Left upper quadrant pain: Secondary | ICD-10-CM | POA: Insufficient documentation

## 2013-03-24 LAB — COMPREHENSIVE METABOLIC PANEL
AST: 30 U/L (ref 0–37)
Albumin: 4.1 g/dL (ref 3.5–5.2)
BUN: 10 mg/dL (ref 6–23)
Chloride: 101 mEq/L (ref 96–112)
Creatinine, Ser: 0.8 mg/dL (ref 0.50–1.35)
Potassium: 3.7 mEq/L (ref 3.5–5.1)
Total Bilirubin: 0.4 mg/dL (ref 0.3–1.2)
Total Protein: 8.2 g/dL (ref 6.0–8.3)

## 2013-03-24 LAB — CBC WITH DIFFERENTIAL/PLATELET
Basophils Absolute: 0 10*3/uL (ref 0.0–0.1)
Basophils Relative: 0 % (ref 0–1)
Eosinophils Absolute: 0.1 10*3/uL (ref 0.0–0.7)
MCH: 29.7 pg (ref 26.0–34.0)
MCHC: 34.8 g/dL (ref 30.0–36.0)
Monocytes Absolute: 0.6 10*3/uL (ref 0.1–1.0)
Monocytes Relative: 4 % (ref 3–12)
Neutro Abs: 14.3 10*3/uL — ABNORMAL HIGH (ref 1.7–7.7)
Neutrophils Relative %: 85 % — ABNORMAL HIGH (ref 43–77)
RDW: 12.8 % (ref 11.5–15.5)

## 2013-03-24 LAB — LIPASE, BLOOD: Lipase: 15 U/L (ref 11–59)

## 2013-03-24 MED ORDER — RANITIDINE HCL 150 MG PO TABS: 150.0000 mg | ORAL_TABLET | Freq: Two times a day (BID) | ORAL | Status: DC

## 2013-03-24 MED ORDER — SODIUM CHLORIDE 0.9 % IV SOLN
INTRAVENOUS | Status: DC
  Administered 2013-03-24: 150 mL/h via INTRAVENOUS

## 2013-03-24 MED ORDER — SUCRALFATE 1 GM/10ML PO SUSP: 1.0000 g | Freq: Three times a day (TID) | ORAL | Status: DC

## 2013-03-24 MED ORDER — HYDROMORPHONE HCL PF 1 MG/ML IJ SOLN
1.0000 mg | Freq: Once | INTRAMUSCULAR | Status: AC
  Administered 2013-03-24: 1 mg via INTRAVENOUS
  Filled 2013-03-24: qty 1

## 2013-03-24 MED ORDER — ONDANSETRON 8 MG PO TBDP
8.0000 mg | ORAL_TABLET | Freq: Once | ORAL | Status: AC
  Administered 2013-03-24: 8 mg via ORAL
  Filled 2013-03-24: qty 1

## 2013-03-24 NOTE — ED Notes (Signed)
Pt reports that he developed acute onset of abdominal pain last pm that has gotten progressively worse during night, no emesis or diarrhea
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
## 2013-03-24 NOTE — ED Notes (Signed)
Pt to [place] monitored by this rn.

## 2013-03-24 NOTE — ED Notes (Signed)
Pt transported to xray, monitored by this rn. Sinus brady noted on monitor, rate of 40-47.

## 2013-03-24 NOTE — ED Provider Notes (Signed)
CSN: [id number]     Arrival date & time 03/24/13  8295 History   First MD Initiated Contact with Patient 03/24/13 0700     Chief Complaint  Patient presents with  . Abdominal Pain   (Consider location/radiation/quality/duration/timing/severity/associated sxs/prior Treatment) HPI Comments: Patient presents to the ER for evaluation of abdominal pain. He reports that he has severe, constant, upper abdominal pain that began yesterday afternoon. He has not had any nausea, vomiting or diarrhea. Patient denies pain into the chest and there is no shortness of breath. He has not had fever.  Patient does report that he has similar episode a week ago, but the pain went away on its own. He never saw a doctor for that. He has not had any symptoms prior to that.  Patient is a 47 y.o. male presenting with abdominal pain.  Abdominal Pain   Past Medical History  Diagnosis Date  . Chronic back pain   . Depression    Past Surgical History  Procedure Laterality Date  . Back surgery    . Shoulder surgery    . Salivary gland surgery     History reviewed. No pertinent family history. History  Substance Use Topics  . Smoking status: Current Some Day Smoker -- 1.00 packs/day    Types: Cigarettes  . Smokeless tobacco: Not on file  . Alcohol Use: Yes     Comment: 2 beers weekly    Review of Systems  Gastrointestinal: Positive for abdominal pain.  All other systems reviewed and are negative.    Allergies  Review of patient's allergies indicates no known allergies.  Home Medications  No current outpatient prescriptions on file. BP 150/96  Pulse 53  Temp(Src) 97.7 F (36.5 C) (Oral)  Resp 18  Ht 6' (1.829 m)  Wt 275 lb (124.739 kg)  BMI 37.29 kg/m2  SpO2 96% Physical Exam  Constitutional: He is oriented to person, place, and time. He appears well-developed and well-nourished. He appears distressed.  HENT:  Head: Normocephalic and atraumatic.  Right Ear: Hearing normal.  Left Ear:  Hearing normal.  Nose: Nose normal.  Mouth/Throat: Oropharynx is clear and moist and mucous membranes are normal.  Eyes: Conjunctivae and EOM are normal. Pupils are equal, round, and reactive to light.  Neck: Normal range of motion. Neck supple.  Cardiovascular: Regular rhythm, S1 normal and S2 normal.  Exam reveals no gallop and no friction rub.   No murmur heard. Pulmonary/Chest: Effort normal and breath sounds normal. No respiratory distress. He exhibits no tenderness.  Abdominal: Soft. Normal appearance and bowel sounds are normal. There is no hepatosplenomegaly. There is tenderness in the right upper quadrant, epigastric area and left upper quadrant. There is no rebound, no guarding, no tenderness at McBurney's point and negative Murphy's sign. No hernia.  Maximal tenderness is mid-epigastric  Musculoskeletal: Normal range of motion.  Neurological: He is alert and oriented to person, place, and time. He has normal strength. No cranial nerve deficit or sensory deficit. Coordination normal. GCS eye subscore is 4. GCS verbal subscore is 5. GCS motor subscore is 6.  Skin: Skin is warm, dry and intact. No rash noted. No cyanosis.  Psychiatric: He has a normal mood and affect. His speech is normal and behavior is normal. Thought content normal.    ED Course  Procedures (including critical care time) Labs Review Labs Reviewed  CBC WITH DIFFERENTIAL - Abnormal; Notable for the following:    WBC 16.8 (*)    Neutrophils Relative % 85 (*)  Neutro Abs 14.3 (*)    Lymphocytes Relative 10 (*)    All other components within normal limits  COMPREHENSIVE METABOLIC PANEL - Abnormal; Notable for the following:    Glucose, Bld 163 (*)    All other components within normal limits  LIPASE, BLOOD   Imaging Review US Abdomen Complete  03/24/2013   CLINICAL DATA:  Severe epigastric discomfort with nausea and vomiting the patient has a history of hepatitis-C and gallstones.  EXAM: ULTRASOUND ABDOMEN  COMPLETE  COMPARISON:  None.  FINDINGS: Gallbladder:  Evaluation of the gallbladder is limited by bowel gas. However, the gallbladder appears adequately distended. There is echogenic material within the gallbladder lumen consistent with sludge or noncalcified stones. The gallbladder wall measures 3 mm which is top-normal. There is no pericholecystic fluid but there is a positive sonographic Murphy's sign.  Common bile duct:  Diameter: 4 mm.  Liver:  The echotexture of the liver is increased diffusely suggesting fatty infiltration. There is no focal mass nor ductal dilation.  IVC:  Evaluation of the inferior vena cava is limited by bowel gas.  Pancreas: Bowel gas limits evaluation of the pancreas. No definite lesion in the visualized portion of the pancreatic body is demonstrated.  Spleen:  The spleen is top-normal in size measuring 12.2 cm in greatest dimension.  Right Kidney:  Length: 11.4. Echogenicity within normal limits. No mass or hydronephrosis visualized.  Left Kidney:  Length: 12.1. Echogenicity within normal limits. No mass or hydronephrosis visualized.  Abdominal aorta:  Evaluation of the abdominal aorta is limited by bowel gas. The maximal measured diameter is 3.1 cm in the proximal portion.  Other findings:  No ascites is demonstrated.  IMPRESSION: 1. The appearance of the gallbladder and the presence of a positive sonographic Murphy's sign may reflect acute cholecystitis. There is sludge present but discrete stones are not demonstrated. 2. The common bile duct, liver, and visualized portions of the pancreas appear normal. 3. The spleen is top-normal in size. The kidneys exhibit no acute abnormalities.   Electronically Signed   By: David  [place]   On: 03/24/2013 08:46   Dg Abd Acute W/chest  03/24/2013   CLINICAL DATA:  Abdominal pain, shortness of breath  EXAM: ACUTE ABDOMEN SERIES (ABDOMEN 2 VIEW & CHEST 1 VIEW)  COMPARISON:  09/15/2010  FINDINGS: Normal heart size without CHF or pneumonia.  Chronic central bronchitic change. No effusion or pneumothorax.  Negative for free air. Scattered air and stool throughout the colon. Degenerative changes of the hip joints, worse on the left. Mild SI joint arthropathy. Pelvic calcifications consistent with venous phleboliths. Degenerative changes of the spine, most pronounced at L3-4.  IMPRESSION: Chronic central bronchitic changes in the chest. No interval change.  Negative for obstruction or free air.   Electronically Signed   By: Ruel Favors M.D.   On: 03/24/2013 07:53    EKG Interpretation    Date/Time:  Friday March 24 2013 07:24:05 EST Ventricular Rate:  45 PR Interval:  158 QRS Duration: 106 QT Interval:  496 QTC Calculation: 429 R Axis:   46 Text Interpretation:  Sinus bradycardia Otherwise normal ECG Since last tracing rate slower Confirmed by POLLINA  MD, CHRISTOPHER ([id number]) on 03/24/2013 7:40:53 AM            MDM  Diagnosis: Abdominal pain  Presents to the ER for evaluation of significant abdominal pain. Patient is in distress on arrival with pain in the center of his upper abdomen in the mid epigastric region. He  was diffusely tender across the upper abdomen, but no focal tenderness in the right upper quadrant or obvious Murphy sign. Lab work was unremarkable other than leukocytosis. Presentation was suspicious for possible gastritis, peptic ulcer disease, the gallbladder was also considered. Ultrasound shows sludge in the gallbladder, cannot rule out cholecystitis, but at this point I do not feel the patient needs emergent surgical consultation. He has significantly improved, now pain-free after treatment with Dilantin. Patient given gallbladder disease diet instructions and will followup at surgery. Return if symptoms worsen. Patient will be treated with ranitidine and Carafate.    Gilda Crease, MD 03/24/13 [phone]

## 2013-08-12 ENCOUNTER — Encounter (HOSPITAL_COMMUNITY): Admission: EM | Disposition: A | Discharge status: Home / Self Care | Payer: Self-pay | Source: Home / Self Care

## 2013-08-12 ENCOUNTER — Emergency Department (HOSPITAL_BASED_OUTPATIENT_CLINIC_OR_DEPARTMENT_OTHER): Payer: Self-pay

## 2013-08-12 ENCOUNTER — Encounter (HOSPITAL_BASED_OUTPATIENT_CLINIC_OR_DEPARTMENT_OTHER): Payer: Self-pay | Admitting: Emergency Medicine

## 2013-08-12 ENCOUNTER — Inpatient Hospital Stay (HOSPITAL_BASED_OUTPATIENT_CLINIC_OR_DEPARTMENT_OTHER)
Admission: EM | Admit: 2013-08-12 | Discharge: 2013-08-16 | DRG: 446 | Disposition: A | Discharge status: Home / Self Care | Payer: Self-pay | Attending: Surgery | Admitting: Surgery

## 2013-08-12 DIAGNOSIS — F329 Major depressive disorder, single episode, unspecified: Secondary | ICD-10-CM | POA: Diagnosis present

## 2013-08-12 DIAGNOSIS — K812 Acute cholecystitis with chronic cholecystitis: Secondary | ICD-10-CM

## 2013-08-12 DIAGNOSIS — F17200 Nicotine dependence, unspecified, uncomplicated: Secondary | ICD-10-CM | POA: Diagnosis present

## 2013-08-12 DIAGNOSIS — Z8619 Personal history of other infectious and parasitic diseases: Secondary | ICD-10-CM

## 2013-08-12 DIAGNOSIS — K8010 Calculus of gallbladder with chronic cholecystitis without obstruction: Secondary | ICD-10-CM | POA: Diagnosis present

## 2013-08-12 DIAGNOSIS — F3289 Other specified depressive episodes: Secondary | ICD-10-CM | POA: Diagnosis present

## 2013-08-12 DIAGNOSIS — R1011 Right upper quadrant pain: Secondary | ICD-10-CM

## 2013-08-12 DIAGNOSIS — K8000 Calculus of gallbladder with acute cholecystitis without obstruction: Principal | ICD-10-CM | POA: Diagnosis present

## 2013-08-12 DIAGNOSIS — Z538 Procedure and treatment not carried out for other reasons: Secondary | ICD-10-CM

## 2013-08-12 DIAGNOSIS — G8929 Other chronic pain: Secondary | ICD-10-CM | POA: Diagnosis present

## 2013-08-12 DIAGNOSIS — Z6837 Body mass index (BMI) 37.0-37.9, adult: Secondary | ICD-10-CM

## 2013-08-12 DIAGNOSIS — M549 Dorsalgia, unspecified: Secondary | ICD-10-CM | POA: Diagnosis present

## 2013-08-12 HISTORY — DX: Unspecified viral hepatitis C without hepatic coma: B19.20

## 2013-08-12 LAB — CBC WITH DIFFERENTIAL/PLATELET
BASOS ABS: 0 10*3/uL (ref 0.0–0.1)
BASOS PCT: 0 % (ref 0–1)
EOS ABS: 0.1 10*3/uL (ref 0.0–0.7)
Eosinophils Relative: 0 % (ref 0–5)
HCT: 48.7 % (ref 39.0–52.0)
Hemoglobin: 17.1 g/dL — ABNORMAL HIGH (ref 13.0–17.0)
Lymphocytes Relative: 12 % (ref 12–46)
Lymphs Abs: 2 10*3/uL (ref 0.7–4.0)
MCH: 30.8 pg (ref 26.0–34.0)
MCHC: 35.1 g/dL (ref 30.0–36.0)
MCV: 87.6 fL (ref 78.0–100.0)
Monocytes Absolute: 1 10*3/uL (ref 0.1–1.0)
Monocytes Relative: 6 % (ref 3–12)
NEUTROS PCT: 81 % — AB (ref 43–77)
Neutro Abs: 13.3 10*3/uL — ABNORMAL HIGH (ref 1.7–7.7)
PLATELETS: 201 10*3/uL (ref 150–400)
RBC: 5.56 MIL/uL (ref 4.22–5.81)
RDW: 13.4 % (ref 11.5–15.5)
WBC: 16.4 10*3/uL — ABNORMAL HIGH (ref 4.0–10.5)

## 2013-08-12 LAB — COMPREHENSIVE METABOLIC PANEL
ALBUMIN: 4.1 g/dL (ref 3.5–5.2)
ALK PHOS: 86 U/L (ref 39–117)
ALT: 60 U/L — AB (ref 0–53)
AST: 27 U/L (ref 0–37)
BUN: 12 mg/dL (ref 6–23)
CO2: 24 mEq/L (ref 19–32)
Calcium: 9.9 mg/dL (ref 8.4–10.5)
Chloride: 102 mEq/L (ref 96–112)
Creatinine, Ser: 0.9 mg/dL (ref 0.50–1.35)
GFR calc Af Amer: >90 mL/min (ref >90)
GFR calc non Af Amer: >90 mL/min (ref >90)
Glucose, Bld: 148 mg/dL — ABNORMAL HIGH (ref 70–99)
POTASSIUM: 4 meq/L (ref 3.7–5.3)
SODIUM: 142 meq/L (ref 137–147)
TOTAL PROTEIN: 8.2 g/dL (ref 6.0–8.3)
Total Bilirubin: 1 mg/dL (ref 0.3–1.2)

## 2013-08-12 LAB — CBC
HEMATOCRIT: 42 % (ref 39.0–52.0)
HEMOGLOBIN: 14.7 g/dL (ref 13.0–17.0)
MCH: 30.5 pg (ref 26.0–34.0)
MCHC: 35 g/dL (ref 30.0–36.0)
MCV: 87.1 fL (ref 78.0–100.0)
Platelets: 143 10*3/uL — ABNORMAL LOW (ref 150–400)
RBC: 4.82 MIL/uL (ref 4.22–5.81)
RDW: 13 % (ref 11.5–15.5)
WBC: 16 10*3/uL — ABNORMAL HIGH (ref 4.0–10.5)

## 2013-08-12 LAB — CREATININE, SERUM
CREATININE: 0.74 mg/dL (ref 0.50–1.35)
GFR calc Af Amer: >90 mL/min (ref >90)
GFR calc non Af Amer: >90 mL/min (ref >90)

## 2013-08-12 LAB — LIPASE, BLOOD: Lipase: 14 U/L (ref 11–59)

## 2013-08-12 SURGERY — LAPAROSCOPIC CHOLECYSTECTOMY WITH INTRAOPERATIVE CHOLANGIOGRAM
Anesthesia: General

## 2013-08-12 MED ORDER — HYDROMORPHONE HCL PF 1 MG/ML IJ SOLN
1.0000 mg | INTRAMUSCULAR | Status: DC | PRN
  Administered 2013-08-12 (×2): 1 mg via INTRAVENOUS
  Filled 2013-08-12: qty 1

## 2013-08-12 MED ORDER — HEPARIN SODIUM (PORCINE) 5000 UNIT/ML IJ SOLN
5000.0000 [IU] | Freq: Three times a day (TID) | INTRAMUSCULAR | Status: DC
  Administered 2013-08-12 – 2013-08-16 (×10): 5000 [IU] via SUBCUTANEOUS
  Filled 2013-08-12 (×14): qty 1

## 2013-08-12 MED ORDER — MORPHINE SULFATE 4 MG/ML IJ SOLN
4.0000 mg | Freq: Once | INTRAMUSCULAR | Status: AC
  Administered 2013-08-12: 4 mg via INTRAVENOUS

## 2013-08-12 MED ORDER — SODIUM CHLORIDE 0.9 % IV BOLUS (SEPSIS)
1000.0000 mL | Freq: Once | INTRAVENOUS | Status: AC
Start: 2013-08-12 — End: 2013-08-12
  Administered 2013-08-12: 1000 mL via INTRAVENOUS

## 2013-08-12 MED ORDER — HYDROMORPHONE HCL PF 1 MG/ML IJ SOLN
1.0000 mg | Freq: Once | INTRAMUSCULAR | Status: AC
  Administered 2013-08-12: 1 mg via INTRAVENOUS
  Filled 2013-08-12: qty 1

## 2013-08-12 MED ORDER — ONDANSETRON HCL 4 MG/2ML IJ SOLN
INTRAMUSCULAR | Status: AC
  Filled 2013-08-12: qty 2

## 2013-08-12 MED ORDER — SODIUM CHLORIDE 0.9 % IJ SOLN
9.0000 mL | INTRAMUSCULAR | Status: DC | PRN
  Administered 2013-08-14: 9 mL via INTRAVENOUS

## 2013-08-12 MED ORDER — ONDANSETRON HCL 4 MG/2ML IJ SOLN: 4.0000 mg | Freq: Four times a day (QID) | INTRAMUSCULAR | Status: DC | PRN

## 2013-08-12 MED ORDER — HYDROMORPHONE HCL PF 1 MG/ML IJ SOLN
INTRAMUSCULAR | Status: AC
  Filled 2013-08-12: qty 1

## 2013-08-12 MED ORDER — NALOXONE HCL 0.4 MG/ML IJ SOLN: 0.4000 mg | INTRAMUSCULAR | Status: DC | PRN

## 2013-08-12 MED ORDER — HYDROCODONE-ACETAMINOPHEN 5-325 MG PO TABS
1.0000 | ORAL_TABLET | ORAL | Status: DC | PRN
  Administered 2013-08-12: 2 via ORAL
  Filled 2013-08-12: qty 2

## 2013-08-12 MED ORDER — DIPHENHYDRAMINE HCL 12.5 MG/5ML PO ELIX: 12.5000 mg | ORAL_SOLUTION | Freq: Four times a day (QID) | ORAL | Status: DC | PRN

## 2013-08-12 MED ORDER — SODIUM CHLORIDE 0.9 % IV BOLUS (SEPSIS)
1000.0000 mL | Freq: Once | INTRAVENOUS | Status: AC
  Administered 2013-08-12: 1000 mL via INTRAVENOUS

## 2013-08-12 MED ORDER — PANTOPRAZOLE SODIUM 40 MG IV SOLR
40.0000 mg | Freq: Every day | INTRAVENOUS | Status: DC
  Administered 2013-08-12 – 2013-08-15 (×4): 40 mg via INTRAVENOUS
  Filled 2013-08-12 (×5): qty 40

## 2013-08-12 MED ORDER — PIPERACILLIN-TAZOBACTAM 3.375 G IVPB 30 MIN
3.3750 g | Freq: Once | INTRAVENOUS | Status: AC
  Administered 2013-08-12: 3.375 g via INTRAVENOUS
  Filled 2013-08-12 (×2): qty 50

## 2013-08-12 MED ORDER — MORPHINE SULFATE 4 MG/ML IJ SOLN
INTRAMUSCULAR | Status: AC
  Filled 2013-08-12: qty 1

## 2013-08-12 MED ORDER — HYDROMORPHONE HCL PF 1 MG/ML IJ SOLN
1.0000 mg | Freq: Once | INTRAMUSCULAR | Status: AC
Start: 2013-08-12 — End: 2013-08-12
  Administered 2013-08-12: 1 mg via INTRAVENOUS
  Filled 2013-08-12: qty 1

## 2013-08-12 MED ORDER — KCL IN DEXTROSE-NACL 20-5-0.45 MEQ/L-%-% IV SOLN
INTRAVENOUS | Status: DC
  Administered 2013-08-12 – 2013-08-15 (×8): via INTRAVENOUS
  Filled 2013-08-12 (×16): qty 1000

## 2013-08-12 MED ORDER — DIPHENHYDRAMINE HCL 50 MG/ML IJ SOLN: 12.5000 mg | Freq: Four times a day (QID) | INTRAMUSCULAR | Status: DC | PRN

## 2013-08-12 MED ORDER — HYDROMORPHONE 0.3 MG/ML IV SOLN
INTRAVENOUS | Status: DC
  Administered 2013-08-12: 14:00:00 via INTRAVENOUS
  Administered 2013-08-12: 4.8 mg via INTRAVENOUS
  Administered 2013-08-12: 5 mg via INTRAVENOUS
  Administered 2013-08-13: 4.464 mg via INTRAVENOUS
  Administered 2013-08-13: 16:00:00 via INTRAVENOUS
  Administered 2013-08-13: 4.5 mg via INTRAVENOUS
  Administered 2013-08-13: 03:00:00 via INTRAVENOUS
  Administered 2013-08-13: 3.3 mg via INTRAVENOUS
  Administered 2013-08-13: 14.2 mg via INTRAVENOUS
  Administered 2013-08-13: 16.2 mg via INTRAVENOUS
  Administered 2013-08-13: 3.9 mg via INTRAVENOUS
  Administered 2013-08-13: 3.22 mg via INTRAVENOUS
  Administered 2013-08-13: 25 mg via INTRAVENOUS
  Administered 2013-08-13: 22:00:00 via INTRAVENOUS
  Administered 2013-08-14: 2.7 mg via INTRAVENOUS
  Administered 2013-08-14: 20:00:00 via INTRAVENOUS
  Administered 2013-08-14: 4.5 mg via INTRAVENOUS
  Administered 2013-08-14: 5.4 mg via INTRAVENOUS
  Administered 2013-08-14 (×2): via INTRAVENOUS
  Administered 2013-08-14: 4.1 mg via INTRAVENOUS
  Administered 2013-08-15: 1.8 mg via INTRAVENOUS
  Administered 2013-08-15: 4.8 mg via INTRAVENOUS
  Administered 2013-08-15: 0.458 mg via INTRAVENOUS
  Administered 2013-08-15: 2.4 mg via INTRAVENOUS
  Filled 2013-08-12 (×10): qty 25

## 2013-08-12 MED ORDER — ONDANSETRON HCL 4 MG/2ML IJ SOLN
4.0000 mg | Freq: Once | INTRAMUSCULAR | Status: AC
  Administered 2013-08-12: 4 mg via INTRAVENOUS

## 2013-08-12 MED ORDER — PIPERACILLIN-TAZOBACTAM 3.375 G IVPB
3.3750 g | Freq: Three times a day (TID) | INTRAVENOUS | Status: DC
Start: 2013-08-12 — End: 2013-08-16
  Administered 2013-08-12 – 2013-08-16 (×12): 3.375 g via INTRAVENOUS
  Filled 2013-08-12 (×15): qty 50

## 2013-08-12 NOTE — H&P (Signed)
Chief Complaint:  Right upper quadrant pain for 4 days  History of Present Illness:  Erik [name] is an 48 y.o. male transferred [facility] with a four-day history of right upper quadrant pain. He previously had a gallbladder ultrasound back last December which showed gallstones. Because of his tenderness and elevated white count a CT was performed which shows a very blurred edematous omentum surrounding acute cholecystitis. Based on his history this appears to be out of the wound 04 safe laparoscopic cholecystectomy it would be best managed with IV antibiotics, percutaneous drainage and interval cholecystectomy. I discussed this with him and will proceed with that game plan.  Past Medical History  Diagnosis Date  . Chronic back pain   . Depression     Past Surgical History  Procedure Laterality Date  . Back surgery    . Shoulder surgery    . Salivary gland surgery      No current facility-administered medications for this encounter.   No current outpatient prescriptions on file.   Review of patient's allergies indicates no known allergies. History reviewed. No pertinent family history. Social History:   reports that he has been smoking Cigarettes.  He has been smoking about 1.00 pack per day. He does not have any smokeless tobacco history on file. He reports that he drinks alcohol. He reports that he uses illicit drugs.   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 rate 17, height 6' (1.829 m), weight 275 lb (124.739 kg), SpO2 98.00%. Body mass index is 37.29 kg/(m^2).  Gen:  WDWN WM NAD  Neurological: Alert and oriented to person, place, and time. Motor and sensory function is grossly intact  Head: Normocephalic and atraumatic.  Eyes: Conjunctivae are normal. Pupils are  equal, round, and reactive to light. No scleral icterus.  Neck: Normal range of motion. Neck supple. No tracheal deviation or thyromegaly present.  Cardiovascular:  SR without murmurs or gallops.  No carotid bruits Respiratory: Effort normal.  No respiratory distress. No chest wall tenderness. Breath sounds normal.  No wheezes, rales or rhonchi.  Abdomen:  Tender in the right upper quadrant GU: Musculoskeletal: Normal range of motion. Extremities are nontender. No cyanosis, edema or clubbing noted Lymphadenopathy: No cervical, preauricular, postauricular or axillary adenopathy is present Skin: Skin is warm and dry. No rash noted. No diaphoresis. No erythema. No pallor. Pscyh: Normal mood and affect. Behavior is normal. Judgment and thought content normal.   LABORATORY RESULTS: Results for orders placed during the hospital encounter of 08/12/13 (from the past 48 hour(s))  CBC WITH DIFFERENTIAL     Status: Abnormal   Collection Time    08/12/13  2:39 AM      Result Value Ref Range   WBC 16.4 (*) 4.0 - 10.5 K/uL   RBC 5.56  4.22 - 5.81 MIL/uL   Hemoglobin 17.1 (*) 13.0 - 17.0 g/dL   HCT 69.9  96.7 - 22.7 %   MCV 87.6  78.0 - 100.0 fL   MCH 30.8  26.0 - 34.0 pg   MCHC 35.1  30.0 - 36.0 g/dL   RDW 73.7  50.5 - 10.7 %   Platelets 201  150 - 400 K/uL   Neutrophils Relative % 81 (*) 43 - 77 %   Neutro Abs 13.3 (*)  1.7 - 7.7 K/uL   Lymphocytes Relative 12  12 - 46 %   Lymphs Abs 2.0  0.7 - 4.0 K/uL   Monocytes Relative 6  3 - 12 %   Monocytes Absolute 1.0  0.1 - 1.0 K/uL   Eosinophils Relative 0  0 - 5 %   Eosinophils Absolute 0.1  0.0 - 0.7 K/uL   Basophils Relative 0  0 - 1 %   Basophils Absolute 0.0  0.0 - 0.1 K/uL  COMPREHENSIVE METABOLIC PANEL     Status: Abnormal   Collection Time    08/12/13  2:39 AM      Result Value Ref Range   Sodium 142  137 - 147 mEq/L   Potassium 4.0  3.7 - 5.3 mEq/L   Chloride 102  96 - 112 mEq/L   CO2 24  19 - 32 mEq/L   Glucose, Bld 148 (*) 70 - 99  mg/dL   BUN 12  6 - 23 mg/dL   Creatinine, Ser 0.90  0.50 - 1.35 mg/dL   Calcium 9.9  8.4 - 10.5 mg/dL   Total Protein 8.2  6.0 - 8.3 g/dL   Albumin 4.1  3.5 - 5.2 g/dL   AST 27  0 - 37 U/L   ALT 60 (*) 0 - 53 U/L   Alkaline Phosphatase 86  39 - 117 U/L   Total Bilirubin 1.0  0.3 - 1.2 mg/dL   GFR calc non Af Amer >90  >90 mL/min   GFR calc Af Amer >90  >90 mL/min   Comment: (NOTE)     The eGFR has been calculated using the CKD EPI equation.     This calculation has not been validated in all clinical situations.     eGFR's persistently <90 mL/min signify possible Chronic Kidney     Disease.  LIPASE, BLOOD     Status: None   Collection Time    08/12/13  2:39 AM      Result Value Ref Range   Lipase 14  11 - 59 U/L    RADIOLOGY RESULTS: Ct Abdomen Pelvis Wo Contrast  08/12/2013   CLINICAL DATA:  Right flank pain.  EXAM: CT ABDOMEN AND PELVIS WITHOUT CONTRAST  TECHNIQUE: Multidetector CT imaging of the abdomen and pelvis was performed following the standard protocol without IV contrast.  COMPARISON:  None.  FINDINGS: BODY WALL: Unremarkable.  LOWER CHEST: Coronary atherosclerosis in the RCA, age advanced.  ABDOMEN/PELVIS:  Liver: No focal abnormality.  Biliary: Although the gallbladder is not dilated, there is wall thickening and pericholecystic fat inflammation. Calcified gallstones seen in the neck of the gallbladder. No biliary ductal dilatation.  Pancreas: Unremarkable.  Spleen: Unremarkable.  Adrenals: Unremarkable.  Kidneys and ureters: No hydronephrosis or stone.  Bladder: Unremarkable.  Reproductive: Unremarkable.  Bowel: No obstruction. No evidence of primary duodenal inflammation to explain the above findings. Normal appendix.  Retroperitoneum: Enlarged porta hepatis and portacaval nodes, likely reactive to the right upper quadrant inflammatory process.  Peritoneum: Small free pelvic fluid, likely reactive.  Vascular: Aortic and branch vessel atherosclerosis, age advanced.  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  IMPRESSION: 1. Cholelithiasis and changes of acute cholecystitis. It is atypical that the gallbladder is not dilated however, consider right upper quadrant ultrasound for further clarification. 2. Coronary atherosclerosis, age advanced.   Electronically Signed   By: Jorje Guild M.D.   On: 08/12/2013 04:12  Problem List: Patient Active Problem List   Diagnosis Date Noted  . Acute cholecystitis with chronic cholecystitis 08/12/2013    Assessment & Plan: Acute and subacute cholecystitis IV Zosyn, ultrasound, and probable percutaneous drainage of gallbladder.    [name] Hassell Done, MD, [facility] [phone] beeper [phone]  08/12/2013 10:21 AM

## 2013-08-12 NOTE — ED Notes (Signed)
MD at bedside. 

## 2013-08-12 NOTE — ED Notes (Signed)
Pt arrived via [facility] from [facility], Zosyn complete, IV flused easily, no swelling noted.  Pt states his pain is now returning, 8/10. Last Dilaudid 5:07am. Dr. Read DriversMolpus at bedside.

## 2013-08-12 NOTE — ED Notes (Signed)
Bed: WA04 Expected date:  Expected time:  Means of arrival:  [facility]

## 2013-08-12 NOTE — ED Provider Notes (Signed)
CSN: [id number]     Arrival date & time 08/12/13  0226 History   First MD Initiated Contact with Patient 08/12/13 0244     Chief Complaint  Patient presents with  . Abdominal Pain     (Consider location/radiation/quality/duration/timing/severity/associated sxs/prior Treatment) Patient is a 48 y.o. male presenting with abdominal pain. The history is provided by the patient.  Abdominal Pain Pain location:  RUQ Pain radiates to:  Does not radiate Pain severity:  Severe Onset quality:  Gradual Duration:  1 day Timing:  Constant Progression:  Worsening Context: retching   Relieved by:  Nothing Worsened by:  Nothing tried Ineffective treatments:  None tried Associated symptoms: nausea and vomiting   Associated symptoms: no chest pain, no diarrhea, no dysuria, no fever, no flatus and no shortness of breath   Vomiting:    Quality:  Stomach contents   Severity:  Moderate   Duration:  2 days   Timing:  Intermittent   Progression:  Unchanged Risk factors: no recent hospitalization     Past Medical History  Diagnosis Date  . Chronic back pain   . Depression    Past Surgical History  Procedure Laterality Date  . Back surgery    . Shoulder surgery    . Salivary gland surgery     History reviewed. No pertinent family history. History  Substance Use Topics  . Smoking status: Current Some Day Smoker -- 1.00 packs/day    Types: Cigarettes  . Smokeless tobacco: Not on file  . Alcohol Use: Yes     Comment: 2 beers weekly    Review of Systems  Constitutional: Negative for fever.  Respiratory: Negative for shortness of breath.   Cardiovascular: Negative for chest pain, palpitations and leg swelling.  Gastrointestinal: Positive for nausea, vomiting and abdominal pain. Negative for diarrhea and flatus.  Genitourinary: Positive for decreased urine volume. Negative for dysuria and difficulty urinating.  All other systems reviewed and are negative.     Allergies  Review of  patient's allergies indicates no known allergies.  Home Medications   Prior to Admission medications   Medication Sig Start Date End Date Taking? Authorizing Provider  ranitidine (ZANTAC) 150 MG tablet Take 1 tablet (150 mg total) by mouth 2 (two) times daily. 03/24/13  Yes Gilda Creasehristopher J. Pollina, MD  sucralfate (CARAFATE) 1 GM/10ML suspension Take 10 mLs (1 g total) by mouth 4 (four) times daily -  with meals and at bedtime. 03/24/13   Gilda Creasehristopher J. Pollina, MD   BP 124/69  Pulse 68  Temp(Src) 99 F (37.2 C) (Oral)  Resp 24  Ht 6' (1.829 m)  Wt 280 lb (127.007 kg)  BMI 37.97 kg/m2  SpO2 96% Physical Exam  Constitutional: He is oriented to person, place, and time. He appears well-developed and well-nourished.  HENT:  Head: Normocephalic and atraumatic.  Mouth/Throat: Oropharynx is clear and moist.  Eyes: Conjunctivae are normal. Pupils are equal, round, and reactive to light.  Neck: Normal range of motion. Neck supple.  Cardiovascular: Normal rate, regular rhythm and intact distal pulses.   Pulmonary/Chest: Effort normal and breath sounds normal. He has no wheezes. He has no rales.  Abdominal: He exhibits no mass. Bowel sounds are decreased. There is tenderness in the right upper quadrant and epigastric area. There is guarding and positive Murphy's sign. There is no rebound and no tenderness at McBurney's point.  Musculoskeletal: Normal range of motion.  Neurological: He is alert and oriented to person, place, and time.  Skin: Skin  is warm and dry. He is not diaphoretic.  Psychiatric: He has a normal mood and affect.    ED Course  Procedures (including critical care time) Labs Review Labs Reviewed  CBC WITH DIFFERENTIAL - Abnormal; Notable for the following:    WBC 16.4 (*)    Hemoglobin 17.1 (*)    Neutrophils Relative % 81 (*)    Neutro Abs 13.3 (*)    All other components within normal limits  COMPREHENSIVE METABOLIC PANEL  LIPASE, BLOOD    Imaging Review No  results found.   EKG Interpretation None      MDM   Final diagnoses:  None   MDM Reviewed: previous chart, nursing note and vitals Reviewed previous: ultrasound and labs Interpretation: labs and CT scan (elevated WBC and ALT) Consults: general surgery   Medications  piperacillin-tazobactam (ZOSYN) IVPB 3.375 g (3.375 g Intravenous New Bag/Given 08/12/13 0509)  ondansetron (ZOFRAN) injection 4 mg (4 mg Intravenous Given 08/12/13 0247)  morphine 4 MG/ML injection 4 mg (4 mg Intravenous Given 08/12/13 0249)  sodium chloride 0.9 % bolus 1,000 mL (0 mLs Intravenous Stopped 08/12/13 0345)  HYDROmorphone (DILAUDID) injection 1 mg (1 mg Intravenous Given 08/12/13 0331)  sodium chloride 0.9 % bolus 1,000 mL (0 mLs Intravenous Stopped 08/12/13 0509)  HYDROmorphone (DILAUDID) injection 1 mg (1 mg Intravenous Given 08/12/13 0507)    Symptoms and exam consistent with cholecystitis.  CT also consistent with acute cholecystitis.  Case d/w Dr. Carolynne Edouardoth [facility]       Precilla Purnell K Nataki Mccrumb-Rasch, MD 08/12/13 [id number]

## 2013-08-12 NOTE — ED Notes (Signed)
abd pain since Friday with n/v, denies diarrhea.

## 2013-08-12 NOTE — ED Notes (Signed)
Surgery at bedside.

## 2013-08-12 NOTE — ED Notes (Signed)
Patient transported to CT 

## 2013-08-13 ENCOUNTER — Inpatient Hospital Stay (HOSPITAL_COMMUNITY): Payer: Self-pay

## 2013-08-13 DIAGNOSIS — K8020 Calculus of gallbladder without cholecystitis without obstruction: Secondary | ICD-10-CM

## 2013-08-13 LAB — CBC WITH DIFFERENTIAL/PLATELET
Basophils Absolute: 0 10*3/uL (ref 0.0–0.1)
Basophils Relative: 0 % (ref 0–1)
Eosinophils Absolute: 0.2 10*3/uL (ref 0.0–0.7)
Eosinophils Relative: 2 % (ref 0–5)
HCT: 43 % (ref 39.0–52.0)
Hemoglobin: 14.4 g/dL (ref 13.0–17.0)
LYMPHS ABS: 2.3 10*3/uL (ref 0.7–4.0)
LYMPHS PCT: 18 % (ref 12–46)
MCH: 29.9 pg (ref 26.0–34.0)
MCHC: 33.5 g/dL (ref 30.0–36.0)
MCV: 89.2 fL (ref 78.0–100.0)
Monocytes Absolute: 1.3 10*3/uL — ABNORMAL HIGH (ref 0.1–1.0)
Monocytes Relative: 10 % (ref 3–12)
NEUTROS ABS: 8.8 10*3/uL — AB (ref 1.7–7.7)
NEUTROS PCT: 70 % (ref 43–77)
PLATELETS: 146 10*3/uL — AB (ref 150–400)
RBC: 4.82 MIL/uL (ref 4.22–5.81)
RDW: 13.2 % (ref 11.5–15.5)
WBC: 12.6 10*3/uL — AB (ref 4.0–10.5)

## 2013-08-13 LAB — COMPREHENSIVE METABOLIC PANEL
ALT: 37 U/L (ref 0–53)
AST: 17 U/L (ref 0–37)
Albumin: 3.1 g/dL — ABNORMAL LOW (ref 3.5–5.2)
Alkaline Phosphatase: 63 U/L (ref 39–117)
BUN: 10 mg/dL (ref 6–23)
CHLORIDE: 102 meq/L (ref 96–112)
CO2: 29 meq/L (ref 19–32)
Calcium: 8.9 mg/dL (ref 8.4–10.5)
Creatinine, Ser: 0.85 mg/dL (ref 0.50–1.35)
GLUCOSE: 116 mg/dL — AB (ref 70–99)
Potassium: 4.4 mEq/L (ref 3.7–5.3)
SODIUM: 140 meq/L (ref 137–147)
Total Bilirubin: 1 mg/dL (ref 0.3–1.2)
Total Protein: 7.1 g/dL (ref 6.0–8.3)

## 2013-08-13 MED ORDER — BIOTENE DRY MOUTH MT LIQD
15.0000 mL | Freq: Two times a day (BID) | OROMUCOSAL | Status: DC
  Administered 2013-08-14: 15 mL via OROMUCOSAL

## 2013-08-13 MED ORDER — NICOTINE 14 MG/24HR TD PT24
14.0000 mg | MEDICATED_PATCH | Freq: Every day | TRANSDERMAL | Status: DC
  Administered 2013-08-13 – 2013-08-15 (×3): 14 mg via TRANSDERMAL
  Filled 2013-08-13 (×4): qty 1

## 2013-08-13 MED ORDER — CHLORHEXIDINE GLUCONATE 0.12 % MT SOLN
15.0000 mL | Freq: Two times a day (BID) | OROMUCOSAL | Status: DC
Start: 2013-08-13 — End: 2013-08-15
  Filled 2013-08-13 (×5): qty 15

## 2013-08-13 NOTE — ED Provider Notes (Signed)
[facility], sent to be evaluated by Dr. Carolynne Edouardoth for acute cholecystitis. Patient is awake and alert in no acute distress. His abdomen is soft with right upper quadrant tenderness. Dr. Carolynne Edouardoth will be contacted; in the meantime we'll treat his pain as needed.    Erik SeamenJohn L Kiearra Oyervides, MD 08/13/13 [phone]

## 2013-08-13 NOTE — Progress Notes (Signed)
Patient ID: Erik [name], male   DOB: Aug 24, 1965, 48 y.o.   MRN: [id number] [facility] Surgery Progress Note:   * No surgery date entered *  Subjective: Mental status is clear.  Thirsty.  Pain controlled with Dialudid PCA Objective: Vital signs in last 24 hours: Temp:  [98 F (36.7 C)-99.2 F (37.3 C)] 98 F (36.7 C) (05/10 0630) Pulse Rate:  [51-108] 57 (05/10 0630) Resp:  [15-20] 18 (05/10 0852) BP: (136)/(75-89) 136/86 mmHg (05/10 0630) SpO2:  [92 %-99 %] 99 % (05/10 0852) Weight:  [270 lb (122.471 kg)] 270 lb (122.471 kg) (05/09 1408)  Intake/Output from previous day: 05/09 0701 - 05/10 0700 In: 2940 [I.V.:2550; IV Piggyback:390] Out: 300 [Urine:300] Intake/Output this shift:    Physical Exam: Work of breathing is normal.  Still tender in right upper quadrant  Lab Results:  Results for orders placed during the hospital encounter of 08/12/13 (from the past 48 hour(s))  CBC WITH DIFFERENTIAL     Status: Abnormal   Collection Time    08/12/13  2:39 AM      Result Value Ref Range   WBC 16.4 (*) 4.0 - 10.5 K/uL   RBC 5.56  4.22 - 5.81 MIL/uL   Hemoglobin 17.1 (*) 13.0 - 17.0 g/dL   HCT 48.7  39.0 - 52.0 %   MCV 87.6  78.0 - 100.0 fL   MCH 30.8  26.0 - 34.0 pg   MCHC 35.1  30.0 - 36.0 g/dL   RDW 13.4  11.5 - 15.5 %   Platelets 201  150 - 400 K/uL   Neutrophils Relative % 81 (*) 43 - 77 %   Neutro Abs 13.3 (*) 1.7 - 7.7 K/uL   Lymphocytes Relative 12  12 - 46 %   Lymphs Abs 2.0  0.7 - 4.0 K/uL   Monocytes Relative 6  3 - 12 %   Monocytes Absolute 1.0  0.1 - 1.0 K/uL   Eosinophils Relative 0  0 - 5 %   Eosinophils Absolute 0.1  0.0 - 0.7 K/uL   Basophils Relative 0  0 - 1 %   Basophils Absolute 0.0  0.0 - 0.1 K/uL  COMPREHENSIVE METABOLIC PANEL     Status: Abnormal   Collection Time    08/12/13  2:39 AM      Result Value Ref Range   Sodium 142  137 - 147 mEq/L   Potassium 4.0  3.7 - 5.3 mEq/L   Chloride 102  96 - 112 mEq/L   CO2 24  19 - 32 mEq/L   Glucose,  Bld 148 (*) 70 - 99 mg/dL   BUN 12  6 - 23 mg/dL   Creatinine, Ser 0.90  0.50 - 1.35 mg/dL   Calcium 9.9  8.4 - 10.5 mg/dL   Total Protein 8.2  6.0 - 8.3 g/dL   Albumin 4.1  3.5 - 5.2 g/dL   AST 27  0 - 37 U/L   ALT 60 (*) 0 - 53 U/L   Alkaline Phosphatase 86  39 - 117 U/L   Total Bilirubin 1.0  0.3 - 1.2 mg/dL   GFR calc non Af Amer >90  >90 mL/min   GFR calc Af Amer >90  >90 mL/min   Comment: (NOTE)     The eGFR has been calculated using the CKD EPI equation.     This calculation has not been validated in all clinical situations.     eGFR's persistently <90 mL/min signify possible Chronic  Kidney     Disease.  LIPASE, BLOOD     Status: None   Collection Time    08/12/13  2:39 AM      Result Value Ref Range   Lipase 14  11 - 59 U/L  CBC     Status: Abnormal   Collection Time    08/12/13 11:54 AM      Result Value Ref Range   WBC 16.0 (*) 4.0 - 10.5 K/uL   RBC 4.82  4.22 - 5.81 MIL/uL   Hemoglobin 14.7  13.0 - 17.0 g/dL   HCT 42.0  39.0 - 52.0 %   MCV 87.1  78.0 - 100.0 fL   MCH 30.5  26.0 - 34.0 pg   MCHC 35.0  30.0 - 36.0 g/dL   RDW 13.0  11.5 - 15.5 %   Platelets 143 (*) 150 - 400 K/uL  CREATININE, SERUM     Status: None   Collection Time    08/12/13 11:54 AM      Result Value Ref Range   Creatinine, Ser 0.74  0.50 - 1.35 mg/dL   GFR calc non Af Amer >90  >90 mL/min   GFR calc Af Amer >90  >90 mL/min   Comment: (NOTE)     The eGFR has been calculated using the CKD EPI equation.     This calculation has not been validated in all clinical situations.     eGFR's persistently <90 mL/min signify possible Chronic Kidney     Disease.  CBC WITH DIFFERENTIAL     Status: Abnormal   Collection Time    08/13/13  5:36 AM      Result Value Ref Range   WBC 12.6 (*) 4.0 - 10.5 K/uL   RBC 4.82  4.22 - 5.81 MIL/uL   Hemoglobin 14.4  13.0 - 17.0 g/dL   HCT 43.0  39.0 - 52.0 %   MCV 89.2  78.0 - 100.0 fL   MCH 29.9  26.0 - 34.0 pg   MCHC 33.5  30.0 - 36.0 g/dL   RDW 13.2  11.5  - 15.5 %   Platelets 146 (*) 150 - 400 K/uL   Neutrophils Relative % 70  43 - 77 %   Neutro Abs 8.8 (*) 1.7 - 7.7 K/uL   Lymphocytes Relative 18  12 - 46 %   Lymphs Abs 2.3  0.7 - 4.0 K/uL   Monocytes Relative 10  3 - 12 %   Monocytes Absolute 1.3 (*) 0.1 - 1.0 K/uL   Eosinophils Relative 2  0 - 5 %   Eosinophils Absolute 0.2  0.0 - 0.7 K/uL   Basophils Relative 0  0 - 1 %   Basophils Absolute 0.0  0.0 - 0.1 K/uL  COMPREHENSIVE METABOLIC PANEL     Status: Abnormal   Collection Time    08/13/13  5:36 AM      Result Value Ref Range   Sodium 140  137 - 147 mEq/L   Potassium 4.4  3.7 - 5.3 mEq/L   Chloride 102  96 - 112 mEq/L   CO2 29  19 - 32 mEq/L   Glucose, Bld 116 (*) 70 - 99 mg/dL   BUN 10  6 - 23 mg/dL   Creatinine, Ser 0.85  0.50 - 1.35 mg/dL   Calcium 8.9  8.4 - 10.5 mg/dL   Total Protein 7.1  6.0 - 8.3 g/dL   Albumin 3.1 (*) 3.5 - 5.2 g/dL   AST 17  0 - 37 U/L   ALT 37  0 - 53 U/L   Alkaline Phosphatase 63  39 - 117 U/L   Total Bilirubin 1.0  0.3 - 1.2 mg/dL   GFR calc non Af Amer >90  >90 mL/min   GFR calc Af Amer >90  >90 mL/min   Comment: (NOTE)     The eGFR has been calculated using the CKD EPI equation.     This calculation has not been validated in all clinical situations.     eGFR's persistently <90 mL/min signify possible Chronic Kidney     Disease.    Radiology/Results: Ct Abdomen Pelvis Wo Contrast  08/12/2013   CLINICAL DATA:  Right flank pain.  EXAM: CT ABDOMEN AND PELVIS WITHOUT CONTRAST  TECHNIQUE: Multidetector CT imaging of the abdomen and pelvis was performed following the standard protocol without IV contrast.  COMPARISON:  None.  FINDINGS: BODY WALL: Unremarkable.  LOWER CHEST: Coronary atherosclerosis in the RCA, age advanced.  ABDOMEN/PELVIS:  Liver: No focal abnormality.  Biliary: Although the gallbladder is not dilated, there is wall thickening and pericholecystic fat inflammation. Calcified gallstones seen in the neck of the gallbladder. No biliary  ductal dilatation.  Pancreas: Unremarkable.  Spleen: Unremarkable.  Adrenals: Unremarkable.  Kidneys and ureters: No hydronephrosis or stone.  Bladder: Unremarkable.  Reproductive: Unremarkable.  Bowel: No obstruction. No evidence of primary duodenal inflammation to explain the above findings. Normal appendix.  Retroperitoneum: Enlarged porta hepatis and portacaval nodes, likely reactive to the right upper quadrant inflammatory process.  Peritoneum: Small free pelvic fluid, likely reactive.  Vascular: Aortic and branch vessel atherosclerosis, age advanced.  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  IMPRESSION: 1. Cholelithiasis and changes of acute cholecystitis. It is atypical that the gallbladder is not dilated however, consider right upper quadrant ultrasound for further clarification. 2. Coronary atherosclerosis, age advanced.   Electronically Signed   By: Jorje Guild M.D.   On: 08/12/2013 04:12    Anti-infectives: Anti-infectives   Start     Dose/Rate Route Frequency Ordered Stop   08/12/13 1400  piperacillin-tazobactam (ZOSYN) IVPB 3.375 g     3.375 g 12.5 mL/hr over 240 Minutes Intravenous 3 times per day 08/12/13 1123     08/12/13 0515  piperacillin-tazobactam (ZOSYN) IVPB 3.375 g     3.375 g 100 mL/hr over 30 Minutes Intravenous  Once 08/12/13 0503 08/12/13 0606      Assessment/Plan: Problem List: Patient Active Problem List   Diagnosis Date Noted  . Acute cholecystitis with chronic cholecystitis 08/12/2013    Ultrasound not done yesterday as ordered.  Pending today.  Will see if amenable to percutaneous drainage.   * No surgery date entered *    LOS: 1 day   [name] Hassell Done, MD, [facility] [phone] beeper [phone]  08/13/2013 9:45 AM

## 2013-08-14 ENCOUNTER — Inpatient Hospital Stay (HOSPITAL_COMMUNITY): Payer: Self-pay

## 2013-08-14 ENCOUNTER — Encounter (HOSPITAL_COMMUNITY): Payer: Self-pay | Admitting: Radiology

## 2013-08-14 LAB — PROTIME-INR
INR: 1.08 (ref 0.00–1.49)
Prothrombin Time: 13.8 seconds (ref 11.6–15.2)

## 2013-08-14 LAB — APTT: aPTT: 32 seconds (ref 24–37)

## 2013-08-14 MED ORDER — MIDAZOLAM HCL 2 MG/2ML IJ SOLN
INTRAMUSCULAR | Status: AC
  Filled 2013-08-14: qty 6

## 2013-08-14 MED ORDER — FENTANYL CITRATE 0.05 MG/ML IJ SOLN
INTRAMUSCULAR | Status: AC
  Filled 2013-08-14: qty 6

## 2013-08-14 MED ORDER — LIDOCAINE HCL 1 % IJ SOLN
INTRAMUSCULAR | Status: AC
  Filled 2013-08-14: qty 20

## 2013-08-14 NOTE — Progress Notes (Signed)
Patient ID: Erik [name], male   DOB: [birth year], 48 y.o.   MRN: [id number]    Subjective: Feels better this morning. Right upper quadrant pain is improved but not resolved. No other complaints.  Objective: Vital signs in last 24 hours: Temp:  [98.1 F (36.7 C)-98.9 F (37.2 C)] 98.1 F (36.7 C) (05/11 0545) Pulse Rate:  [52-71] 52 (05/11 0545) Resp:  [12-21] 18 (05/11 0800) BP: (125-144)/(77-80) 125/80 mmHg (05/11 0545) SpO2:  [95 %-99 %] 99 % (05/11 0800) Last BM Date: 08/11/13  Intake/Output from previous day: 05/10 0701 - 05/11 0700 In: 3750 [P.O.:600; I.V.:3000; IV Piggyback:150] Out: 4100 [Urine:4100] Intake/Output this shift: Total I/O In: -  Out: 450 [Urine:450]  General appearance: alert, cooperative and no distress GI: localized right upper quadrant tenderness with some guarding. No palpable mass. No distention.  Lab Results:   Recent Labs  08/12/13 1154 08/13/13 0536  WBC 16.0* 12.6*  HGB 14.7 14.4  HCT 42.0 43.0  PLT 143* 146*   BMET  Recent Labs  08/12/13 0239 08/12/13 1154 08/13/13 0536  NA 142  --  140  K 4.0  --  4.4  CL 102  --  102  CO2 24  --  29  GLUCOSE 148*  --  116*  BUN 12  --  10  CREATININE 0.90 0.74 0.85  CALCIUM 9.9  --  8.9     Studies/Results: [place] Abdomen Limited Ruq  08/13/2013   CLINICAL DATA:  Right flank pain. Cholecystitis and cholelithiasis suggested on recent CT  EXAM: US ABDOMEN LIMITED - RIGHT UPPER QUADRANT  COMPARISON:  CT ABD/PELV WO CM dated 08/12/2013  FINDINGS: Gallbladder:  Incompletely distended. Wall thickening diffusely up to 4.3 mm. Echogenic calculi and sludge in the lumen. There is a trace amount of pericholecystic fluid. Sonographer reports a positive sonographic Murphy's sign.  Common bile duct:  Diameter: 6.9 mm  Liver:  No focal lesion identified. No definite intrahepatic biliary ductal dilatation. Within normal limits in parenchymal echogenicity.  IMPRESSION: 1. Cholecystitis with stones.    Electronically Signed   By: Oley Balmaniel  Hassell M.D.   On: 08/13/2013 10:51    Anti-infectives: Anti-infectives   Start     Dose/Rate Route Frequency Ordered Stop   08/12/13 1400  piperacillin-tazobactam (ZOSYN) IVPB 3.375 g     3.375 g 12.5 mL/hr over 240 Minutes Intravenous 3 times per day 08/12/13 1123     08/12/13 0515  piperacillin-tazobactam (ZOSYN) IVPB 3.375 g     3.375 g 100 mL/hr over 30 Minutes Intravenous  Once 08/12/13 0503 08/12/13 0606      Assessment/Plan: Cholelithiasis and acute cholecystitis of at least 6 or 7 days duration with significant inflammatory change. As per previous outlined plan we will request percutaneous drainage of the gallbladder today with plans for eventual cholecystectomy. This plan as well as options for immediate surgery was discussed with the patient and he is in agreement. Overall he appears improved with less pain and decreased white count.    LOS: 2 days    Mariella SaaBenjamin T Eryanna Regal 08/14/2013

## 2013-08-14 NOTE — Procedures (Signed)
The initial sonographic survey over the abdomen revealed that the GB is completely decompressed around the stones. Safe Cholecystostomy is not possible at this time. We can re-attempt in 48 hrs if desired.

## 2013-08-14 NOTE — Consult Note (Signed)
HPI: Erik [name] is an 48 y.o. male admitted with about a one week hx of abd pain. Found to have acute cholecystitis. Been on IV abx for past 48hrs but not making much progress. Surgical team requests perc chole drain at this time and plans interval cholecystectomy at later date. PMHx and meds reviewed. Hx of Hep C per pt.  Past Medical History:  Past Medical History  Diagnosis Date  . Chronic back pain   . Depression   . Hepatitis C     Past Surgical History:  Past Surgical History  Procedure Laterality Date  . Back surgery    . Shoulder surgery    . Salivary gland surgery      Family History: History reviewed. No pertinent family history.  Social History:  reports that he has been smoking Cigarettes.  He has been smoking about 1.00 pack per day. He does not have any smokeless tobacco history on file. He reports that he drinks alcohol. He reports that he uses illicit drugs.  Allergies: No Known Allergies  Medications: Current facility-administered medications:antiseptic oral rinse (BIOTENE) solution 15 mL, 15 mL, Mouth Rinse, q12n4p, Pedro Earls, MD;  chlorhexidine (PERIDEX) 0.12 % solution 15 mL, 15 mL, Mouth Rinse, BID, Pedro Earls, MD;  dextrose 5 % and 0.45 % NaCl with KCl 20 mEq/L infusion, , Intravenous, Continuous, Pedro Earls, MD, Last Rate: 125 mL/hr at 08/14/13 0827 diphenhydrAMINE (BENADRYL) 12.5 MG/5ML elixir 12.5 mg, 12.5 mg, Oral, Q6H PRN, Pedro Earls, MD;  diphenhydrAMINE (BENADRYL) 12.5 MG/5ML elixir 12.5-25 mg, 12.5-25 mg, Oral, Q6H PRN, Pedro Earls, MD;  diphenhydrAMINE (BENADRYL) injection 12.5 mg, 12.5 mg, Intravenous, Q6H PRN, Pedro Earls, MD;  diphenhydrAMINE (BENADRYL) injection 12.5-25 mg, 12.5-25 mg, Intravenous, Q6H PRN, Pedro Earls, MD heparin injection 5,000 Units, 5,000 Units, Subcutaneous, 3 times per day, Pedro Earls, MD, 5,000 Units at 08/14/13 0509;  HYDROcodone-acetaminophen (NORCO/VICODIN) 5-325 MG per tablet  1-2 tablet, 1-2 tablet, Oral, Q4H PRN, Pedro Earls, MD, 2 tablet at 08/12/13 1753;  HYDROmorphone (DILAUDID) injection 1 mg, 1 mg, Intravenous, Q2H PRN, Pedro Earls, MD, 1 mg at 08/12/13 1351 HYDROmorphone (DILAUDID) PCA injection 0.3 mg/mL, , Intravenous, 6 times per day, Pedro Earls, MD, 2.7 mg at 08/14/13 0800;  naloxone [facility]) injection 0.4 mg, 0.4 mg, Intravenous, PRN, Pedro Earls, MD;  nicotine (NICODERM CQ - dosed in mg/24 hours) patch 14 mg, 14 mg, Transdermal, Daily, Pedro Earls, MD, 14 mg at 08/14/13 0914;  ondansetron [facility]) injection 4 mg, 4 mg, Intravenous, Q6H PRN, Pedro Earls, MD ondansetron [facility]) injection 4 mg, 4 mg, Intravenous, Q6H PRN, Pedro Earls, MD;  pantoprazole (PROTONIX) injection 40 mg, 40 mg, Intravenous, QHS, Pedro Earls, MD, 40 mg at 08/13/13 2134;  piperacillin-tazobactam (ZOSYN) IVPB 3.375 g, 3.375 g, Intravenous, 3 times per day, Pedro Earls, MD, 3.375 g at 08/14/13 0509;  sodium chloride 0.9 % injection 9 mL, 9 mL, Intravenous, PRN, Pedro Earls, MD   Please HPI for pertinent positives, otherwise complete 10 system ROS negative.  Physical Exam: BP 125/80  Pulse 52  Temp(Src) 98.1 F (36.7 C) (Oral)  Resp 18  Ht 6' (1.829 m)  Wt 270 lb (122.471 kg)  BMI 36.61 kg/m2  SpO2 99% Body mass index is 36.61 kg/(m^2).   General Appearance:  Alert, cooperative, no distress, appears stated age  Head:  Normocephalic, without obvious abnormality, atraumatic  ENT: Unremarkable  Neck: Supple, symmetrical,  trachea midline  Lungs:   Clear to auscultation bilaterally, no w/r/r, respirations unlabored without use of accessory muscles.  Chest Wall:  No tenderness or deformity  Heart:  Regular rate and rhythm, S1, S2 normal, no murmur, rub or gallop.  Abdomen:   Soft, mildly tender RUQ, no peritonitis  Neurologic: Normal affect, no gross deficits.   Results for orders placed during the hospital encounter of 08/12/13  (from the past 48 hour(s))  CBC     Status: Abnormal   Collection Time    08/12/13 11:54 AM      Result Value Ref Range   WBC 16.0 (*) 4.0 - 10.5 K/uL   RBC 4.82  4.22 - 5.81 MIL/uL   Hemoglobin 14.7  13.0 - 17.0 g/dL   HCT 42.0  39.0 - 52.0 %   MCV 87.1  78.0 - 100.0 fL   MCH 30.5  26.0 - 34.0 pg   MCHC 35.0  30.0 - 36.0 g/dL   RDW 13.0  11.5 - 15.5 %   Platelets 143 (*) 150 - 400 K/uL  CREATININE, SERUM     Status: None   Collection Time    08/12/13 11:54 AM      Result Value Ref Range   Creatinine, Ser 0.74  0.50 - 1.35 mg/dL   GFR calc non Af Amer >90  >90 mL/min   GFR calc Af Amer >90  >90 mL/min   Comment: (NOTE)     The eGFR has been calculated using the CKD EPI equation.     This calculation has not been validated in all clinical situations.     eGFR's persistently <90 mL/min signify possible Chronic Kidney     Disease.  CBC WITH DIFFERENTIAL     Status: Abnormal   Collection Time    08/13/13  5:36 AM      Result Value Ref Range   WBC 12.6 (*) 4.0 - 10.5 K/uL   RBC 4.82  4.22 - 5.81 MIL/uL   Hemoglobin 14.4  13.0 - 17.0 g/dL   HCT 43.0  39.0 - 52.0 %   MCV 89.2  78.0 - 100.0 fL   MCH 29.9  26.0 - 34.0 pg   MCHC 33.5  30.0 - 36.0 g/dL   RDW 13.2  11.5 - 15.5 %   Platelets 146 (*) 150 - 400 K/uL   Neutrophils Relative % 70  43 - 77 %   Neutro Abs 8.8 (*) 1.7 - 7.7 K/uL   Lymphocytes Relative 18  12 - 46 %   Lymphs Abs 2.3  0.7 - 4.0 K/uL   Monocytes Relative 10  3 - 12 %   Monocytes Absolute 1.3 (*) 0.1 - 1.0 K/uL   Eosinophils Relative 2  0 - 5 %   Eosinophils Absolute 0.2  0.0 - 0.7 K/uL   Basophils Relative 0  0 - 1 %   Basophils Absolute 0.0  0.0 - 0.1 K/uL  COMPREHENSIVE METABOLIC PANEL     Status: Abnormal   Collection Time    08/13/13  5:36 AM      Result Value Ref Range   Sodium 140  137 - 147 mEq/L   Potassium 4.4  3.7 - 5.3 mEq/L   Chloride 102  96 - 112 mEq/L   CO2 29  19 - 32 mEq/L   Glucose, Bld 116 (*) 70 - 99 mg/dL   BUN 10  6 - 23 mg/dL    Creatinine, Ser 0.85  0.50 - 1.35  mg/dL   Calcium 8.9  8.4 - 10.5 mg/dL   Total Protein 7.1  6.0 - 8.3 g/dL   Albumin 3.1 (*) 3.5 - 5.2 g/dL   AST 17  0 - 37 U/L   ALT 37  0 - 53 U/L   Alkaline Phosphatase 63  39 - 117 U/L   Total Bilirubin 1.0  0.3 - 1.2 mg/dL   GFR calc non Af Amer >90  >90 mL/min   GFR calc Af Amer >90  >90 mL/min   Comment: (NOTE)     The eGFR has been calculated using the CKD EPI equation.     This calculation has not been validated in all clinical situations.     eGFR's persistently <90 mL/min signify possible Chronic Kidney     Disease.   US Abdomen Limited Ruq  08/13/2013   CLINICAL DATA:  Right flank pain. Cholecystitis and cholelithiasis suggested on recent CT  EXAM: US ABDOMEN LIMITED - RIGHT UPPER QUADRANT  COMPARISON:  CT ABD/PELV WO CM dated 08/12/2013  FINDINGS: Gallbladder:  Incompletely distended. Wall thickening diffusely up to 4.3 mm. Echogenic calculi and sludge in the lumen. There is a trace amount of pericholecystic fluid. Sonographer reports a positive sonographic Murphy's sign.  Common bile duct:  Diameter: 6.9 mm  Liver:  No focal lesion identified. No definite intrahepatic biliary ductal dilatation. Within normal limits in parenchymal echogenicity.  IMPRESSION: 1. Cholecystitis with stones.   Electronically Signed   By: Arne Cleveland M.D.   On: 08/13/2013 10:51    Assessment/Plan Acute cholecystitis Images reviewed by Dr. Barbie Banner. GB not distended as typically expected. Wall thickening with stones but may not be a great candidate for drain placement. Dr. Barbie Banner to d/w Dr. Excell Seltzer, consider NM HIDA scan. Discussed procedure, risks, complications, use of sedation in the event we move forward with procedure. Labs reviewed, will check coags. Received Sub-Q heparin this am. Consent signed in chart  Ascencion Dike PA-C 08/14/2013, 10:27 AM

## 2013-08-14 NOTE — Sedation Documentation (Signed)
Procedure canceled by Dr. Bonnielee HaffHoss.  Pt transported back to 1523 in bed.

## 2013-08-15 DIAGNOSIS — K8000 Calculus of gallbladder with acute cholecystitis without obstruction: Secondary | ICD-10-CM

## 2013-08-15 LAB — CBC
HEMATOCRIT: 43.1 % (ref 39.0–52.0)
Hemoglobin: 14.9 g/dL (ref 13.0–17.0)
MCH: 29.9 pg (ref 26.0–34.0)
MCHC: 34.6 g/dL (ref 30.0–36.0)
MCV: 86.4 fL (ref 78.0–100.0)
Platelets: 175 10*3/uL (ref 150–400)
RBC: 4.99 MIL/uL (ref 4.22–5.81)
RDW: 12.7 % (ref 11.5–15.5)
WBC: 9.7 10*3/uL (ref 4.0–10.5)

## 2013-08-15 MED ORDER — OXYCODONE HCL 5 MG PO TABS
5.0000 mg | ORAL_TABLET | ORAL | Status: DC | PRN
  Administered 2013-08-15 – 2013-08-16 (×5): 10 mg via ORAL
  Filled 2013-08-15 (×5): qty 2

## 2013-08-15 NOTE — Progress Notes (Signed)
Patient interviewed and examined, agree with PA note above. Unable to place percutaneous drain but he continues to improve now just minimal tenderness. CBC pending. Advance diet. Continue IV antibiotics for now.  Mariella SaaBenjamin T Amyiah Gaba MD, FACS  08/15/2013 8:34 AM

## 2013-08-15 NOTE — Progress Notes (Signed)
Patient ID: Erik [name], male   DOB: 1965-04-18, 48 y.o.   MRN: [id number]    Subjective: Pt feels better today.  About 80% better than when he came in.  Tolerating a regular diet with no increase in pain  Objective: Vital signs in last 24 hours: Temp:  [97.5 F (36.4 C)-99.4 F (37.4 C)] 97.5 F (36.4 C) (05/12 0524) Pulse Rate:  [56-81] 56 (05/12 0524) Resp:  [12-22] 12 (05/12 0526) BP: (116-140)/(76-89) 116/76 mmHg (05/12 0524) SpO2:  [92 %-100 %] 96 % (05/12 0526) FiO2 (%):  [36 %] 36 % (05/12 0526) Last BM Date:  (PTA)  Intake/Output from previous day: 05/11 0701 - 05/12 0700 In: 3815.7 [P.O.:240; I.V.:3425.7; IV Piggyback:150] Out: 3050 [Urine:3050] Intake/Output this shift:    PE: Abd: soft, mildly tender in RUQ, +BS, ND Heart: regular Lungs: CTAB  Lab Results:   Recent Labs  08/12/13 1154 08/13/13 0536  WBC 16.0* 12.6*  HGB 14.7 14.4  HCT 42.0 43.0  PLT 143* 146*   BMET  Recent Labs  08/12/13 1154 08/13/13 0536  NA  --  140  K  --  4.4  CL  --  102  CO2  --  29  GLUCOSE  --  116*  BUN  --  10  CREATININE 0.74 0.85  CALCIUM  --  8.9   PT/INR  Recent Labs  08/14/13 1025  LABPROT 13.8  INR 1.08   CMP     Component Value Date/Time   NA 140 08/13/2013 0536   K 4.4 08/13/2013 0536   CL 102 08/13/2013 0536   CO2 29 08/13/2013 0536   GLUCOSE 116* 08/13/2013 0536   BUN 10 08/13/2013 0536   CREATININE 0.85 08/13/2013 0536   CALCIUM 8.9 08/13/2013 0536   PROT 7.1 08/13/2013 0536   ALBUMIN 3.1* 08/13/2013 0536   AST 17 08/13/2013 0536   ALT 37 08/13/2013 0536   ALKPHOS 63 08/13/2013 0536   BILITOT 1.0 08/13/2013 0536   GFRNONAA >90 08/13/2013 0536   GFRAA >90 08/13/2013 0536   Lipase     Component Value Date/Time   LIPASE 14 08/12/2013 0239       Studies/Results: Ir Fluoro Rm 30-60 Min  08/14/2013   CLINICAL DATA:  Acute cholecystitis  EXAM: IR FLOURO RM 0-60 MIN  COMPARISON:  None.  FINDINGS: Sonographic survey over the liver demonstrates  that the gallbladder is completely decompressed. Gallstones are noted. Cholecystostomy tube placement is not possible at this time.  IMPRESSION: Cholecystostomy could not be performed. The gallbladder was completely decompressed. If desired, the patient can be brought back in 48 hrs for a re-attempt.   Electronically Signed   By: Maryclare BeanArt  Hoss M.D.   On: 08/14/2013 16:10    Anti-infectives: Anti-infectives   Start     Dose/Rate Route Frequency Ordered Stop   08/12/13 1400  piperacillin-tazobactam (ZOSYN) IVPB 3.375 g     3.375 g 12.5 mL/hr over 240 Minutes Intravenous 3 times per day 08/12/13 1123     08/12/13 0515  piperacillin-tazobactam (ZOSYN) IVPB 3.375 g     3.375 g 100 mL/hr over 30 Minutes Intravenous  Once 08/12/13 0503 08/12/13 0606       Assessment/Plan  1. Acute cholecystitis  Plan: 1. SLIV fluids today.  Cont IV abx therapy for now. 2. Check labs and make sure WBC is coming down 3. Cont regular diet 4. Home when pain is resolved 5. Plan for interval cholecystectomy   LOS: 3 days  Letha CapeKelly E Darrelyn Morro 08/15/2013, 8:28 AM Pager: [phone]

## 2013-08-15 NOTE — Care Management Note (Addendum)
    Page 1 of 1   08/16/2013     7:59:19 AM CARE MANAGEMENT NOTE 08/16/2013  Patient:  Erik [name], Erik [name]   Account Number:  [id number]  Date Initiated:  08/15/2013  Documentation initiated by:  [facility]  Subjective/Objective Assessment:   adm: R upper quadrant pain x 4 days; ACUTE CHOLECYSTITIS     Action/Plan:   discharge planning/plan for interval cholecystectomy   Anticipated DC Date:  08/16/2013   Anticipated DC Plan:  [place]  CM consult  Medication Assistance  [place] Program      Choice offered to / List presented to:             Status of service:  Completed, signed off Medicare Important Message given?   (If response is "NO", the following Medicare IM given date fields will be blank) Date Medicare IM given:   Date Additional Medicare IM given:    Discharge Disposition:  HOME/SELF CARE  Per UR Regulation:    If discussed at Long Length of Stay Meetings, dates discussed:    Comments:  08/16/13 07:46 Late entry: CM received callback from [facility].  Pt has an appt. to get an orange card, pursue insurance/Medicaid, follow up visits on September 05, 2013 at 12:00.  Because the pt has an existing appt., he can bring his prescriptons from discharging directly to the [facility] to be filled.  Pt made aware and understands he is to go directly to the [facility] upon discharge to get his medications.  No other CM needs were communicated.  Mariane Masters, BSN, Jearld Lesch [phone].  08/15/13 15:20 CM met with pt who states he has no insurance.  Pt has had financial counselor meet with him. CM called [facility] to secure an appt for orange card, and to pursue Medicaid/insurance; waiting for callback for appt. time.  If appt not secured before discharge tomorrow, pt candidate for [facility].  If appt is secured, pt can take prescriptions to [facility] upon discharge to have them filled.  Will continue to follow for med asst.  Mariane Masters,  BSN, [place] Pena.

## 2013-08-16 LAB — BASIC METABOLIC PANEL
BUN: 6 mg/dL (ref 6–23)
CO2: 26 mEq/L (ref 19–32)
CREATININE: 0.91 mg/dL (ref 0.50–1.35)
Calcium: 9.6 mg/dL (ref 8.4–10.5)
Chloride: 104 mEq/L (ref 96–112)
GFR calc non Af Amer: >90 mL/min (ref >90)
Glucose, Bld: 101 mg/dL — ABNORMAL HIGH (ref 70–99)
Potassium: 4 mEq/L (ref 3.7–5.3)
Sodium: 140 mEq/L (ref 137–147)

## 2013-08-16 LAB — CBC
HCT: 41.9 % (ref 39.0–52.0)
HEMOGLOBIN: 14.3 g/dL (ref 13.0–17.0)
MCH: 29.5 pg (ref 26.0–34.0)
MCHC: 34.1 g/dL (ref 30.0–36.0)
MCV: 86.6 fL (ref 78.0–100.0)
PLATELETS: 192 10*3/uL (ref 150–400)
RBC: 4.84 MIL/uL (ref 4.22–5.81)
RDW: 12.9 % (ref 11.5–15.5)
WBC: 8.3 10*3/uL (ref 4.0–10.5)

## 2013-08-16 MED ORDER — PANTOPRAZOLE SODIUM 40 MG PO TBEC: 40.0000 mg | DELAYED_RELEASE_TABLET | Freq: Every day | ORAL | Status: DC

## 2013-08-16 MED ORDER — AMOXICILLIN-POT CLAVULANATE 875-125 MG PO TABS
1.0000 | ORAL_TABLET | Freq: Two times a day (BID) | ORAL | Status: DC
  Filled 2013-08-16 (×2): qty 1

## 2013-08-16 MED ORDER — OXYCODONE HCL 5 MG PO TABS: 5.0000 mg | ORAL_TABLET | ORAL | Status: DC | PRN

## 2013-08-16 MED ORDER — AMOXICILLIN-POT CLAVULANATE 875-125 MG PO TABS
1.0000 | ORAL_TABLET | Freq: Two times a day (BID) | ORAL | Status: DC
  Administered 2013-08-16: 1 via ORAL
  Filled 2013-08-16 (×2): qty 1

## 2013-08-16 MED ORDER — AMOXICILLIN-POT CLAVULANATE 875-125 MG PO TABS: 1.0000 | ORAL_TABLET | Freq: Two times a day (BID) | ORAL | Status: DC

## 2013-08-16 NOTE — Discharge Instructions (Signed)
Cholecystitis Cholecystitis is an inflammation of your gallbladder. It is usually caused by a buildup of gallstones or sludge (cholelithiasis) in your gallbladder. The gallbladder stores a fluid that helps digest fats (bile). Cholecystitis is serious and needs treatment right away.  CAUSES   Gallstones. Gallstones can block the tube that leads to your gallbladder, causing bile to build up. As bile builds up, the gallbladder becomes inflamed.  Bile duct problems, such as blockage from scarring or kinking.  Tumors. Tumors can stop bile from leaving your gallbladder correctly, causing bile to build up. As bile builds up, the gallbladder becomes inflamed. SYMPTOMS   Nausea.  Vomiting.  Abdominal pain, especially in the upper right area of your abdomen.  Abdominal tenderness or bloating.  Sweating.  Chills.  Fever.  Yellowing of the skin and the whites of the eyes (jaundice). DIAGNOSIS  Your caregiver may order blood tests to look for infection or gallbladder problems. Your caregiver may also order imaging tests, such as an ultrasound or computed tomography (CT) scan. Further tests may include a hepatobiliary iminodiacetic acid (HIDA) scan. This scan allows your caregiver to see your bile move from the liver to the gallbladder and to the small intestine. TREATMENT  A hospital stay is usually necessary to lessen the inflammation of your gallbladder. You may be required to not eat or drink (fast) for a certain amount of time. You may be given medicine to treat pain or an antibiotic medicine to treat an infection. Surgery may be needed to remove your gallbladder (cholecystectomy) once the inflammation has gone down. Surgery may be needed right away if you develop complications such as death of gallbladder tissue (gangrene) or a tear (perforation) of the gallbladder.  HOME CARE INSTRUCTIONS  Home care will depend on your treatment. In general:  If you were given antibiotics, take them as  directed. Finish them even if you start to feel better.  Only take over-the-counter or prescription medicines for pain, discomfort, or fever as directed by your caregiver.  Follow a low-fat diet until you see your caregiver again.  Keep all follow-up visits as directed by your caregiver. SEEK IMMEDIATE MEDICAL CARE IF:   Your pain is increasing and not controlled by medicines.  Your pain moves to another part of your abdomen or to your back.  You have a fever.  You have nausea and vomiting. MAKE SURE YOU:  Understand these instructions.  Will watch your condition.  Will get help right away if you are not doing well or get worse. Document Released: 03/23/2005 Document Revised: 06/15/2011 Document Reviewed: 02/06/2011 ExitCare Patient Information 2014 [employer].  

## 2013-08-16 NOTE — Progress Notes (Signed)
This patient is receiving pantoprazole. Based on criteria approved by the Pharmacy and Therapeutics Committee, this medication is being converted to the equivalent oral dose form. These criteria include:   . The patient is eating (either orally or per tube) and/or has been taking other orally administered medications for at least 24 hours.  . This patient has no evidence of active gastrointestinal bleeding or impaired GI absorption (gastrectomy, short bowel, patient on TNA or NPO).   If you have questions about this conversion, please contact the pharmacy department. Ext [phone]  Tad MooreDustin George MedinaZeigler, [facility] CenterRPH 08/16/2013 11:26 AM

## 2013-08-16 NOTE — Progress Notes (Signed)
  Subjective: He denies any pain says PO's are not causing him any difficulty.  He does not work secondary to his chronic back issues.  When I ask him about pain medicine use, he says his abdomen still hurts some.  Objective: Vital signs in last 24 hours: Temp:  [98.1 F (36.7 C)-98.5 F (36.9 C)] 98.1 F (36.7 C) (05/13 0552) Pulse Rate:  [58-63] 60 (05/13 0552) Resp:  [18-20] 18 (05/13 0552) BP: (108-126)/(67-74) 108/67 mmHg (05/13 0552) SpO2:  [95 %-97 %] 97 % (05/13 0552) Last BM Date: 08/15/13 1440 Po recorded Diet: cardiac Afebrile, VSS Labs all look good. Intake/Output from previous day: 05/12 0701 - 05/13 0700 In: 1590 [P.O.:1440; IV Piggyback:150] Out: 2300 [Urine:2300] Intake/Output this shift:    General appearance: alert, cooperative and no distress GI: soft, non-tender; bowel sounds normal; no masses,  no organomegaly  Lab Results:   Recent Labs  08/15/13 0820 08/16/13 0458  WBC 9.7 8.3  HGB 14.9 14.3  HCT 43.1 41.9  PLT 175 192    BMET  Recent Labs  08/16/13 0458  NA 140  K 4.0  CL 104  CO2 26  GLUCOSE 101*  BUN 6  CREATININE 0.91  CALCIUM 9.6   PT/INR  Recent Labs  08/14/13 1025  LABPROT 13.8  INR 1.08     Recent Labs Lab 08/12/13 0239 08/13/13 0536  AST 27 17  ALT 60* 37  ALKPHOS 86 63  BILITOT 1.0 1.0  PROT 8.2 7.1  ALBUMIN 4.1 3.1*     Lipase     Component Value Date/Time   LIPASE 14 08/12/2013 0239     Studies/Results: Ir Fluoro Rm 30-60 Min  08/14/2013   CLINICAL DATA:  Acute cholecystitis  EXAM: IR FLOURO RM 0-60 MIN  COMPARISON:  None.  FINDINGS: Sonographic survey over the liver demonstrates that the gallbladder is completely decompressed. Gallstones are noted. Cholecystostomy tube placement is not possible at this time.  IMPRESSION: Cholecystostomy could not be performed. The gallbladder was completely decompressed. If desired, the patient can be brought back in 48 hrs for a re-attempt.   Electronically Signed    By: Maryclare BeanArt  Hoss M.D.   On: 08/14/2013 16:10    Medications: . heparin  5,000 Units Subcutaneous 3 times per day  . nicotine  14 mg Transdermal Daily  . pantoprazole (PROTONIX) IV  40 mg Intravenous QHS  . piperacillin-tazobactam (ZOSYN)  IV  3.375 g Intravenous 3 times per day   . dextrose 5 % and 0.45 % NaCl with KCl 20 mEq/L 125 mL/hr at 08/15/13 0912   Prior to Admission medications   Not on File    Assessment/Plan 1. Acute cholecystitis with chronic cholecystitis; s/p attempted IR PERC CHOLECYSTOSTOMY, 08/14/13, which could not be done due to decompressed GB. 2.  Chronic back pain 3.  Depression   Plan:   He has had 5 days of antibiotics and feels better. Still some pain walking. We hope to send home later today with 9 more days of Augmentin. i will wait till Dr. Johna SheriffHoxworth sees him to make final decision.  He can follow up with Dr. Daphine DeutscherMartin.  LOS: 4 days    Sherrie GeorgeWillard Tian [name] 08/16/2013

## 2013-08-16 NOTE — Progress Notes (Signed)
Patient interviewed and examined, agree with PA note above.  Mariella SaaBenjamin T Carole Doner MD, FACS  08/16/2013 5:53 PM

## 2013-08-16 NOTE — Progress Notes (Signed)
Patient interviewed and examined, agree with PA note above. He denies any pain. Eating without difficulty. On my exam abdomen is nontender without fullness or mass. He appears okay for discharge on oral antibiotics with close followup in the office. Mariella SaaBenjamin T Kensley Valladares MD, FACS  08/16/2013 1:12 PM

## 2013-08-16 NOTE — Progress Notes (Signed)
Physician Discharge Summary   Patient ID: Erik [name] MRN: [id number] DOB/AGE: 11/06/1965 47 y.o.  Admit date: 08/12/2013 Discharge date: 08/16/2013  Admission Diagnoses: 1. Acute cholecystitis with chronic cholecystitis;   2. Chronic back pain   3. Depression 4.  Hx of Tobacco/alcohol use  Discharge Diagnoses:   same Active Problems:   Acute cholecystitis with chronic cholecystitis   PROCEDURES: s/p attempted IR PERC CHOLECYSTOSTOMY, 08/14/13, which could not be done due to decompressed GB.    Hospital Course: [name] is an 47 y.o. male [facility] with a four-day history of right upper quadrant pain. He previously had a gallbladder ultrasound back last December which showed gallstones. Because of his tenderness and elevated white count a CT was performed which shows a very blurred edematous omentum surrounding acute cholecystitis. Based on his history this appears to be out of the wound 04 safe laparoscopic cholecystectomy it would be best managed with IV antibiotics, percutaneous drainage and interval cholecystectomy. I discussed this with him and will proceed with that game plan.  He was seen and admitted by Dr. Martin.  He started him on IV antibiotics.  We ask IR to drain the Gallbladder but it was decompressed and drain could not be placed.  He was maintained on IV antibiotics.  His symptoms improved, WBC came down, he was advanced to a low fat diet and he was ready for discharge on 5/13.  He will follow up with Dr. Martin.  We plan on sending him home with 10 more days of antibiotics. He is awaiting disability for chronic back pain and does not work, hx of tobacco/ETOH use.  Condition on d/c:  Improved           Disposition: 01-Home or Self Care            Future Appointments  Provider  Department  Dept Phone     09/05/2013 12:00 PM  [facility] Financial Counselor  [facility]  [phone]           Medication List              amoxicillin-clavulanate 875-125 MG per tablet   Commonly known as:  AUGMENTIN   Take 1 tablet by mouth every 12 (twelve) hours.        oxyCODONE 5 MG immediate release tablet   Commonly known as:  Oxy IR/ROXICODONE   Take 1-2 tablets (5-10 mg total) by mouth every 4 (four) hours as needed for moderate pain.          Follow-up Information     Follow up with [facility]    . (You have an appt June 2, at 12:00.  You may go directly to the [facility] after discharge to get your prescriptions filled.)      Contact information:     [address]   [place] [address] [phone]          Schedule an appointment as soon as possible for a visit with TALBOT, DAVID C, MD. (As needed FOR MEDICAL ISSUES)      Specialty:  Internal Medicine     Contact information:     [address]  [address] [place] [address] [phone]           Schedule an appointment as soon as possible for a visit with MARTIN,MATTHEW B, MD. (Follow up for your gallbladder issues)      

## 2013-08-17 NOTE — Discharge Summary (Signed)
Physician Discharge Summary   Patient ID: Erik KrabbeMichael T Chavana MRN: [id number] DOB/AGE: [birth year] 47 y.o.  Admit date: 08/12/2013 Discharge date: 08/16/2013  Admission Diagnoses: 1. Acute cholecystitis with chronic cholecystitis;   2. Chronic back pain   3. Depression 4.  Hx of Tobacco/alcohol use  Discharge Diagnoses:   same Active Problems:   Acute cholecystitis with chronic cholecystitis   PROCEDURES: s/p attempted IR PERC CHOLECYSTOSTOMY, 08/14/13, which could not be done due to decompressed GB.    Hospital Course: Erik KrabbeMichael T Mccombie is an 48 y.o. male [facility] with a four-day history of right upper quadrant pain. He previously had a gallbladder ultrasound back last December which showed gallstones. Because of his tenderness and elevated white count a CT was performed which shows a very blurred edematous omentum surrounding acute cholecystitis. Based on his history this appears to be out of the wound 04 safe laparoscopic cholecystectomy it would be best managed with IV antibiotics, percutaneous drainage and interval cholecystectomy. I discussed this with him and will proceed with that game plan.  He was seen and admitted by Dr. Daphine DeutscherMartin.  He started him on IV antibiotics.  We ask IR to drain the Gallbladder but it was decompressed and drain could not be placed.  He was maintained on IV antibiotics.  His symptoms improved, WBC came down, he was advanced to a low fat diet and he was ready for discharge on 5/13.  He will follow up with Dr. Daphine DeutscherMartin.  We plan on sending him home with 10 more days of antibiotics. He is awaiting disability for chronic back pain and does not work, hx of tobacco/ETOH use.  Condition on d/c:  Improved           Disposition: 01-Home or Self Care            Future Appointments  Provider  Department  Dept Phone     09/05/2013 12:00 PM  [facility] Financial Counselor  [facility]  [phone]           Medication List              amoxicillin-clavulanate 875-125 MG per tablet   Commonly known as:  AUGMENTIN   Take 1 tablet by mouth every 12 (twelve) hours.        oxyCODONE 5 MG immediate release tablet   Commonly known as:  Oxy IR/ROXICODONE   Take 1-2 tablets (5-10 mg total) by mouth every 4 (four) hours as needed for moderate pain.          Follow-up Information     Follow up with [facility]    . (You have an appt June 2, at 12:00.  You may go directly to [facility] after discharge to get your prescriptions filled.)      Contact information:     [address]  [place] [address] [phone]          Schedule an appointment as soon as possible for a visit with TALBOT, DAVID C, MD. (As needed FOR MEDICAL ISSUES)      Specialty:  Internal Medicine     Contact information:     [address]  [address] [place] [address] [phone]           Schedule an appointment as soon as possible for a visit with Luretha MurphyMARTIN,MATTHEW B, MD. (Follow up for your gallbladder issues)  Specialty:  General Surgery     Contact information:     [address]  [address] [place] [address] [phone]         Signed: Sherrie GeorgeWillard Lyndy Russman 08/16/2013, 2:30 PM

## 2013-09-05 ENCOUNTER — Ambulatory Visit: Payer: Self-pay | Attending: Internal Medicine

## 2013-09-21 ENCOUNTER — Observation Stay (HOSPITAL_COMMUNITY)
Admission: EM | Admit: 2013-09-21 | Discharge: 2013-09-24 | Disposition: A | Discharge status: Home / Self Care | Payer: Self-pay | Attending: General Surgery | Admitting: General Surgery

## 2013-09-21 ENCOUNTER — Encounter (HOSPITAL_COMMUNITY): Payer: Self-pay | Admitting: Emergency Medicine

## 2013-09-21 DIAGNOSIS — K812 Acute cholecystitis with chronic cholecystitis: Secondary | ICD-10-CM

## 2013-09-21 DIAGNOSIS — Z9049 Acquired absence of other specified parts of digestive tract: Secondary | ICD-10-CM

## 2013-09-21 DIAGNOSIS — B1920 Unspecified viral hepatitis C without hepatic coma: Secondary | ICD-10-CM | POA: Insufficient documentation

## 2013-09-21 DIAGNOSIS — R1011 Right upper quadrant pain: Secondary | ICD-10-CM

## 2013-09-21 DIAGNOSIS — E669 Obesity, unspecified: Secondary | ICD-10-CM | POA: Insufficient documentation

## 2013-09-21 DIAGNOSIS — K810 Acute cholecystitis: Principal | ICD-10-CM | POA: Insufficient documentation

## 2013-09-21 DIAGNOSIS — F17200 Nicotine dependence, unspecified, uncomplicated: Secondary | ICD-10-CM | POA: Insufficient documentation

## 2013-09-21 LAB — CBC WITH DIFFERENTIAL/PLATELET
BASOS ABS: 0.1 10*3/uL (ref 0.0–0.1)
Basophils Relative: 0 % (ref 0–1)
Eosinophils Absolute: 0.1 10*3/uL (ref 0.0–0.7)
Eosinophils Relative: 1 % (ref 0–5)
HCT: 44.4 % (ref 39.0–52.0)
Hemoglobin: 15.4 g/dL (ref 13.0–17.0)
Lymphocytes Relative: 9 % — ABNORMAL LOW (ref 12–46)
Lymphs Abs: 1.2 10*3/uL (ref 0.7–4.0)
MCH: 30.2 pg (ref 26.0–34.0)
MCHC: 34.7 g/dL (ref 30.0–36.0)
MCV: 87.1 fL (ref 78.0–100.0)
Monocytes Absolute: 0.6 10*3/uL (ref 0.1–1.0)
Monocytes Relative: 4 % (ref 3–12)
NEUTROS ABS: 11.1 10*3/uL — AB (ref 1.7–7.7)
NEUTROS PCT: 86 % — AB (ref 43–77)
Platelets: 163 10*3/uL (ref 150–400)
RBC: 5.1 MIL/uL (ref 4.22–5.81)
RDW: 13 % (ref 11.5–15.5)
WBC: 13 10*3/uL — ABNORMAL HIGH (ref 4.0–10.5)

## 2013-09-21 LAB — COMPREHENSIVE METABOLIC PANEL
ALT: 58 U/L — AB (ref 0–53)
AST: 35 U/L (ref 0–37)
Albumin: 3.9 g/dL (ref 3.5–5.2)
Alkaline Phosphatase: 89 U/L (ref 39–117)
BUN: 12 mg/dL (ref 6–23)
CALCIUM: 9.7 mg/dL (ref 8.4–10.5)
CHLORIDE: 104 meq/L (ref 96–112)
CO2: 23 meq/L (ref 19–32)
Creatinine, Ser: 0.86 mg/dL (ref 0.50–1.35)
GFR calc Af Amer: >90 mL/min (ref >90)
Glucose, Bld: 137 mg/dL — ABNORMAL HIGH (ref 70–99)
Potassium: 4.2 mEq/L (ref 3.7–5.3)
SODIUM: 139 meq/L (ref 137–147)
Total Bilirubin: 0.4 mg/dL (ref 0.3–1.2)
Total Protein: 7.8 g/dL (ref 6.0–8.3)

## 2013-09-21 LAB — LIPASE, BLOOD: Lipase: 25 U/L (ref 11–59)

## 2013-09-21 MED ORDER — SODIUM CHLORIDE 0.9 % IV SOLN
INTRAVENOUS | Status: DC
  Administered 2013-09-21: 16:00:00 via INTRAVENOUS

## 2013-09-21 MED ORDER — PANTOPRAZOLE SODIUM 40 MG IV SOLR
40.0000 mg | Freq: Every day | INTRAVENOUS | Status: DC
  Administered 2013-09-21: 40 mg via INTRAVENOUS
  Filled 2013-09-21 (×2): qty 40

## 2013-09-21 MED ORDER — ONDANSETRON HCL 4 MG/2ML IJ SOLN: 4.0000 mg | Freq: Four times a day (QID) | INTRAMUSCULAR | Status: DC | PRN

## 2013-09-21 MED ORDER — DIPHENHYDRAMINE HCL 12.5 MG/5ML PO ELIX: 12.5000 mg | ORAL_SOLUTION | Freq: Four times a day (QID) | ORAL | Status: DC | PRN

## 2013-09-21 MED ORDER — SODIUM CHLORIDE 0.9 % IV BOLUS (SEPSIS)
1000.0000 mL | Freq: Once | INTRAVENOUS | Status: AC
  Administered 2013-09-21: 1000 mL via INTRAVENOUS

## 2013-09-21 MED ORDER — ACETAMINOPHEN 325 MG PO TABS: 650.0000 mg | ORAL_TABLET | Freq: Four times a day (QID) | ORAL | Status: DC | PRN

## 2013-09-21 MED ORDER — ONDANSETRON HCL 4 MG/2ML IJ SOLN
4.0000 mg | Freq: Once | INTRAMUSCULAR | Status: AC
  Administered 2013-09-21: 4 mg via INTRAVENOUS
  Filled 2013-09-21: qty 2

## 2013-09-21 MED ORDER — CIPROFLOXACIN IN D5W 400 MG/200ML IV SOLN
400.0000 mg | Freq: Two times a day (BID) | INTRAVENOUS | Status: DC
  Administered 2013-09-21 – 2013-09-22 (×2): 400 mg via INTRAVENOUS
  Filled 2013-09-21 (×3): qty 200

## 2013-09-21 MED ORDER — KCL IN DEXTROSE-NACL 20-5-0.45 MEQ/L-%-% IV SOLN
INTRAVENOUS | Status: DC
  Administered 2013-09-21 – 2013-09-22 (×2): via INTRAVENOUS
  Filled 2013-09-21 (×4): qty 1000

## 2013-09-21 MED ORDER — HYDROMORPHONE HCL PF 1 MG/ML IJ SOLN
2.0000 mg | Freq: Once | INTRAMUSCULAR | Status: AC
  Administered 2013-09-21: 2 mg via INTRAVENOUS
  Filled 2013-09-21: qty 2

## 2013-09-21 MED ORDER — ACETAMINOPHEN 650 MG RE SUPP: 650.0000 mg | Freq: Four times a day (QID) | RECTAL | Status: DC | PRN

## 2013-09-21 MED ORDER — HYDROMORPHONE HCL PF 1 MG/ML IJ SOLN
0.5000 mg | INTRAMUSCULAR | Status: DC | PRN
  Administered 2013-09-21: 2 mg via INTRAVENOUS
  Administered 2013-09-21: 1 mg via INTRAVENOUS
  Administered 2013-09-21: 2 mg via INTRAVENOUS
  Administered 2013-09-21: 1 mg via INTRAVENOUS
  Administered 2013-09-22 (×4): 2 mg via INTRAVENOUS
  Filled 2013-09-21: qty 2
  Filled 2013-09-21: qty 1
  Filled 2013-09-21 (×4): qty 2
  Filled 2013-09-21: qty 1
  Filled 2013-09-21: qty 2

## 2013-09-21 MED ORDER — CITALOPRAM HYDROBROMIDE 20 MG PO TABS
20.0000 mg | ORAL_TABLET | Freq: Every day | ORAL | Status: DC
  Administered 2013-09-21: 20 mg via ORAL
  Filled 2013-09-21 (×2): qty 1

## 2013-09-21 MED ORDER — DIPHENHYDRAMINE HCL 50 MG/ML IJ SOLN: 12.5000 mg | Freq: Four times a day (QID) | INTRAMUSCULAR | Status: DC | PRN

## 2013-09-21 NOTE — ED Notes (Signed)
Pt states that he has gallbladder issues.  States that he has been having RUQ pain that radiates to back since this morning.  Vomiting but no diarrhea.

## 2013-09-21 NOTE — ED Notes (Signed)
Pt is aware of the need for urine, pt unable to provide a sample at this time.

## 2013-09-21 NOTE — H&P (Signed)
Erik [name] Apr 30, 1965  [id number].   Primary Care MD: none Chief Complaint/Reason for Consult: abdominal pain, cholecystitis HPI: This is a 48 yo white male with a h/o Hep C who had cholecystitis about a month ago and was treated conservatively.  He has been doing well at home and was supposed to see Dr. Hassell Done in the office next week to discuss interval cholecystectomy.  He woke up this morning with horrible RUQ abdominal pain and nausea/vomiting.  No fevers, but clammy and sweating.  He came to the [facility] where his WBC was 13 and LFTs normal except ALT mildly elevated at 58.  We have been asked to see him for admission for his gallbladder  ROS : Please see HPI, otherwise negative  History reviewed. No pertinent family history.  Past Medical History  Diagnosis Date  . Chronic back pain   . Depression   . Hepatitis C     Past Surgical History  Procedure Laterality Date  . Back surgery    . Shoulder surgery    . Salivary gland surgery      Social History:  reports that he has been smoking Cigarettes.  He has been smoking about 1.00 pack per day. He has never used smokeless tobacco. He reports that he drinks alcohol. He reports that he uses illicit drugs.  Allergies: No Known Allergies   (Not in a hospital admission)  Blood pressure 130/86, pulse 60, temperature 98.4 F (36.9 C), temperature source Oral, resp. rate 22, SpO2 98.00%. Physical Exam: General: pleasant, WD, WN, but ill-appearing white male who is laying in bed in NAD HEENT: head is normocephalic, atraumatic.  Sclera are noninjected.  PERRL.  Ears and nose without any masses or lesions.  Mouth is pink and moist Heart: regular, rate, and rhythm.  Normal s1,s2. No obvious murmurs, gallops, or rubs noted.  Palpable radial and pedal pulses bilaterally Lungs: CTAB, no wheezes, rhonchi, or rales noted.  Respiratory effort nonlabored Abd: soft, quite tender in RUQ with voluntary guarding, + Murphy's sign, ND, +BS, no  masses, hernias, or organomegaly MS: all 4 extremities are symmetrical with no cyanosis, clubbing, or edema. Skin: warm and dry with no masses, lesions, or rashes Psych: A&Ox3 with an appropriate affect.    Results for orders placed during the hospital encounter of 09/21/13 (from the past 48 hour(s))  CBC WITH DIFFERENTIAL     Status: Abnormal   Collection Time    09/21/13  1:34 PM      Result Value Ref Range   WBC 13.0 (*) 4.0 - 10.5 K/uL   RBC 5.10  4.22 - 5.81 MIL/uL   Hemoglobin 15.4  13.0 - 17.0 g/dL   HCT 44.4  39.0 - 52.0 %   MCV 87.1  78.0 - 100.0 fL   MCH 30.2  26.0 - 34.0 pg   MCHC 34.7  30.0 - 36.0 g/dL   RDW 13.0  11.5 - 15.5 %   Platelets 163  150 - 400 K/uL   Neutrophils Relative % 86 (*) 43 - 77 %   Neutro Abs 11.1 (*) 1.7 - 7.7 K/uL   Lymphocytes Relative 9 (*) 12 - 46 %   Lymphs Abs 1.2  0.7 - 4.0 K/uL   Monocytes Relative 4  3 - 12 %   Monocytes Absolute 0.6  0.1 - 1.0 K/uL   Eosinophils Relative 1  0 - 5 %   Eosinophils Absolute 0.1  0.0 - 0.7 K/uL   Basophils Relative 0  0 - 1 %   Basophils Absolute 0.1  0.0 - 0.1 K/uL  COMPREHENSIVE METABOLIC PANEL     Status: Abnormal   Collection Time    09/21/13  1:34 PM      Result Value Ref Range   Sodium 139  137 - 147 mEq/L   Potassium 4.2  3.7 - 5.3 mEq/L   Chloride 104  96 - 112 mEq/L   CO2 23  19 - 32 mEq/L   Glucose, Bld 137 (*) 70 - 99 mg/dL   BUN 12  6 - 23 mg/dL   Creatinine, Ser 0.86  0.50 - 1.35 mg/dL   Calcium 9.7  8.4 - 10.5 mg/dL   Total Protein 7.8  6.0 - 8.3 g/dL   Albumin 3.9  3.5 - 5.2 g/dL   AST 35  0 - 37 U/L   ALT 58 (*) 0 - 53 U/L   Alkaline Phosphatase 89  39 - 117 U/L   Total Bilirubin 0.4  0.3 - 1.2 mg/dL   GFR calc non Af Amer >90  >90 mL/min   GFR calc Af Amer >90  >90 mL/min   Comment: (NOTE)     The eGFR has been calculated using the CKD EPI equation.     This calculation has not been validated in all clinical situations.     eGFR's persistently <90 mL/min signify possible  Chronic Kidney     Disease.  LIPASE, BLOOD     Status: None   Collection Time    09/21/13  1:34 PM      Result Value Ref Range   Lipase 25  11 - 59 U/L   No results found.     Assessment/Plan 1. Acute cholecystitis 2. H/o Hep C  Plan: 1. We will get the patient admitted and start him on IV abx therapy.  We will plan on cholecystectomy tomorrow.  This has been d/w the patient and his mother.  They both agree with this plan.  OSBORNE,KELLY E 09/21/2013, 2:45 PM Pager: [phone]

## 2013-09-21 NOTE — ED Provider Notes (Signed)
CSN: [id number]     Arrival date & time 09/21/13  1223 History   First MD Initiated Contact with Patient 09/21/13 1256     Chief Complaint  Patient presents with  . Abdominal Pain  . Nausea  . Emesis     (Consider location/radiation/quality/duration/timing/severity/associated sxs/prior Treatment) Patient is a 48 y.o. male presenting with abdominal pain and vomiting. The history is provided by the patient.  Abdominal Pain Associated symptoms: vomiting   Emesis Associated symptoms: abdominal pain    patient here complaining of severe right upper quadrant pain. History of cholecystitis that was treated with attempted percutaneous drainage which was unsuccessful. Pain began today in similar to his prior gallbladder disease. Nonbilious vomiting without fever noted. No diarrhea. Symptoms persisted and no medications used prior to arrival. Nothing makes the symptoms better or worse.  Past Medical History  Diagnosis Date  . Chronic back pain   . Depression   . Hepatitis C    Past Surgical History  Procedure Laterality Date  . Back surgery    . Shoulder surgery    . Salivary gland surgery     No family history on file. History  Substance Use Topics  . Smoking status: Current Some Day Smoker -- 1.00 packs/day    Types: Cigarettes  . Smokeless tobacco: Not on file  . Alcohol Use: Yes     Comment: 2 beers weekly    Review of Systems  Gastrointestinal: Positive for vomiting and abdominal pain.  All other systems reviewed and are negative.     Allergies  Review of patient's allergies indicates no known allergies.  Home Medications   Prior to Admission medications   Medication Sig Start Date End Date Taking? Authorizing Annis Lagoy  amoxicillin-clavulanate (AUGMENTIN) 875-125 MG per tablet Take 1 tablet by mouth every 12 (twelve) hours. 08/16/13   Sherrie GeorgeWillard Jennings, PA-C  oxyCODONE (OXY IR/ROXICODONE) 5 MG immediate release tablet Take 1-2 tablets (5-10 mg total) by mouth every 4  (four) hours as needed for moderate pain. 08/16/13   Sherrie GeorgeWillard Jennings, PA-C   BP 130/86  Pulse 60  Temp(Src) 98.4 F (36.9 C) (Oral)  Resp 22  SpO2 98% Physical Exam  Nursing note and vitals reviewed. Constitutional: He is oriented to person, place, and time. He appears well-developed and well-nourished.  Non-toxic appearance. No distress.  HENT:  Head: Normocephalic and atraumatic.  Eyes: Conjunctivae, EOM and lids are normal. Pupils are equal, round, and reactive to light.  Neck: Normal range of motion. Neck supple. No tracheal deviation present. No mass present.  Cardiovascular: Normal rate, regular rhythm and normal heart sounds.  Exam reveals no gallop.   No murmur heard. Pulmonary/Chest: Effort normal and breath sounds normal. No stridor. No respiratory distress. He has no decreased breath sounds. He has no wheezes. He has no rhonchi. He has no rales.  Abdominal: Soft. Normal appearance and bowel sounds are normal. He exhibits no distension. There is tenderness in the right upper quadrant and epigastric area. There is no rigidity, no rebound, no guarding and no CVA tenderness.    Musculoskeletal: Normal range of motion. He exhibits no edema and no tenderness.  Neurological: He is alert and oriented to person, place, and time. He has normal strength. No cranial nerve deficit or sensory deficit. GCS eye subscore is 4. GCS verbal subscore is 5. GCS motor subscore is 6.  Skin: Skin is warm and dry. No abrasion and no rash noted.  Psychiatric: He has a normal mood and affect. His speech  is normal and behavior is normal.    ED Course  Procedures (including critical care time) Labs Review Labs Reviewed  CBC WITH DIFFERENTIAL  COMPREHENSIVE METABOLIC PANEL  LIPASE, BLOOD  URINALYSIS, ROUTINE W REFLEX MICROSCOPIC    Imaging Review No results found.   EKG Interpretation None      MDM   Final diagnoses:  None    Patient has been by general surgery, will be  admitted    Toy BakerAnthony T Allen, MD 09/21/13 1437

## 2013-09-21 NOTE — Progress Notes (Signed)
[facility] CL spoke with patient who stated that he had a [employer] Card. Patient did not have card with him, but does have one with [facility].

## 2013-09-22 ENCOUNTER — Observation Stay (HOSPITAL_COMMUNITY): Payer: Self-pay | Admitting: Anesthesiology

## 2013-09-22 ENCOUNTER — Encounter (HOSPITAL_COMMUNITY): Payer: Self-pay | Admitting: Certified Registered Nurse Anesthetist

## 2013-09-22 ENCOUNTER — Encounter (HOSPITAL_COMMUNITY): Payer: MEDICAID | Admitting: Anesthesiology

## 2013-09-22 ENCOUNTER — Encounter (HOSPITAL_COMMUNITY)
Admission: EM | Disposition: A | Discharge status: Home / Self Care | Payer: Self-pay | Source: Home / Self Care | Attending: Emergency Medicine

## 2013-09-22 ENCOUNTER — Observation Stay (HOSPITAL_COMMUNITY): Payer: MEDICAID

## 2013-09-22 ENCOUNTER — Observation Stay (HOSPITAL_COMMUNITY): Payer: Self-pay

## 2013-09-22 DIAGNOSIS — Z9049 Acquired absence of other specified parts of digestive tract: Secondary | ICD-10-CM

## 2013-09-22 DIAGNOSIS — K810 Acute cholecystitis: Secondary | ICD-10-CM

## 2013-09-22 HISTORY — PX: CHOLECYSTECTOMY: SHX55

## 2013-09-22 LAB — CBC
HCT: 42.9 % (ref 39.0–52.0)
Hemoglobin: 14.4 g/dL (ref 13.0–17.0)
MCH: 29.4 pg (ref 26.0–34.0)
MCHC: 33.6 g/dL (ref 30.0–36.0)
MCV: 87.6 fL (ref 78.0–100.0)
PLATELETS: 161 10*3/uL (ref 150–400)
RBC: 4.9 MIL/uL (ref 4.22–5.81)
RDW: 13.3 % (ref 11.5–15.5)
WBC: 15 10*3/uL — ABNORMAL HIGH (ref 4.0–10.5)

## 2013-09-22 LAB — COMPREHENSIVE METABOLIC PANEL
ALBUMIN: 3.3 g/dL — AB (ref 3.5–5.2)
ALK PHOS: 75 U/L (ref 39–117)
ALT: 42 U/L (ref 0–53)
AST: 22 U/L (ref 0–37)
BILIRUBIN TOTAL: 0.4 mg/dL (ref 0.3–1.2)
BUN: 8 mg/dL (ref 6–23)
CHLORIDE: 105 meq/L (ref 96–112)
CO2: 22 mEq/L (ref 19–32)
Calcium: 9.1 mg/dL (ref 8.4–10.5)
Creatinine, Ser: 0.82 mg/dL (ref 0.50–1.35)
GFR calc Af Amer: >90 mL/min (ref >90)
GFR calc non Af Amer: >90 mL/min (ref >90)
Glucose, Bld: 113 mg/dL — ABNORMAL HIGH (ref 70–99)
POTASSIUM: 3.9 meq/L (ref 3.7–5.3)
Sodium: 140 mEq/L (ref 137–147)
Total Protein: 6.7 g/dL (ref 6.0–8.3)

## 2013-09-22 LAB — SURGICAL PCR SCREEN
MRSA, PCR: NEGATIVE
Staphylococcus aureus: NEGATIVE

## 2013-09-22 SURGERY — LAPAROSCOPIC CHOLECYSTECTOMY WITH INTRAOPERATIVE CHOLANGIOGRAM
Anesthesia: General | Site: Abdomen

## 2013-09-22 MED ORDER — PROPOFOL 10 MG/ML IV BOLUS
INTRAVENOUS | Status: AC
  Filled 2013-09-22: qty 20

## 2013-09-22 MED ORDER — OXYCODONE HCL 5 MG PO TABS
5.0000 mg | ORAL_TABLET | ORAL | Status: DC | PRN
  Administered 2013-09-23 – 2013-09-24 (×8): 10 mg via ORAL
  Filled 2013-09-22 (×8): qty 2

## 2013-09-22 MED ORDER — GLYCOPYRROLATE 0.2 MG/ML IJ SOLN
INTRAMUSCULAR | Status: AC
  Filled 2013-09-22: qty 3

## 2013-09-22 MED ORDER — ACETAMINOPHEN 500 MG PO TABS
1000.0000 mg | ORAL_TABLET | Freq: Three times a day (TID) | ORAL | Status: DC
  Administered 2013-09-22 – 2013-09-23 (×4): 1000 mg via ORAL
  Filled 2013-09-22 (×9): qty 2

## 2013-09-22 MED ORDER — ACETAMINOPHEN 325 MG PO TABS: 650.0000 mg | ORAL_TABLET | ORAL | Status: DC | PRN

## 2013-09-22 MED ORDER — ONDANSETRON HCL 4 MG/2ML IJ SOLN
INTRAMUSCULAR | Status: AC
  Filled 2013-09-22: qty 2

## 2013-09-22 MED ORDER — ONDANSETRON HCL 4 MG/2ML IJ SOLN
INTRAMUSCULAR | Status: DC | PRN
  Administered 2013-09-22: 4 mg via INTRAVENOUS

## 2013-09-22 MED ORDER — FENTANYL CITRATE 0.05 MG/ML IJ SOLN
INTRAMUSCULAR | Status: AC
  Filled 2013-09-22: qty 5

## 2013-09-22 MED ORDER — MIDAZOLAM HCL 2 MG/2ML IJ SOLN
INTRAMUSCULAR | Status: AC
  Filled 2013-09-22: qty 2

## 2013-09-22 MED ORDER — PROPOFOL 10 MG/ML IV BOLUS
INTRAVENOUS | Status: DC | PRN
  Administered 2013-09-22: 200 mg via INTRAVENOUS

## 2013-09-22 MED ORDER — LACTATED RINGERS IV SOLN
INTRAVENOUS | Status: DC
  Administered 2013-09-22: 1000 mL via INTRAVENOUS
  Administered 2013-09-22: 13:00:00 via INTRAVENOUS

## 2013-09-22 MED ORDER — NEOSTIGMINE METHYLSULFATE 10 MG/10ML IV SOLN
INTRAVENOUS | Status: DC | PRN
  Administered 2013-09-22: 2 mg via INTRAVENOUS

## 2013-09-22 MED ORDER — MORPHINE SULFATE 2 MG/ML IJ SOLN
1.0000 mg | INTRAMUSCULAR | Status: DC | PRN
  Administered 2013-09-22 (×6): 1 mg via INTRAVENOUS
  Filled 2013-09-22 (×6): qty 1

## 2013-09-22 MED ORDER — CISATRACURIUM BESYLATE 20 MG/10ML IV SOLN
INTRAVENOUS | Status: AC
  Filled 2013-09-22: qty 10

## 2013-09-22 MED ORDER — DEXAMETHASONE SODIUM PHOSPHATE 4 MG/ML IJ SOLN
INTRAMUSCULAR | Status: DC | PRN
  Administered 2013-09-22: 10 mg via INTRAVENOUS

## 2013-09-22 MED ORDER — LIDOCAINE HCL (CARDIAC) 20 MG/ML IV SOLN
INTRAVENOUS | Status: AC
  Filled 2013-09-22: qty 5

## 2013-09-22 MED ORDER — HYDROCODONE-ACETAMINOPHEN 5-325 MG PO TABS: 1.0000 | ORAL_TABLET | ORAL | Status: DC | PRN

## 2013-09-22 MED ORDER — KCL IN DEXTROSE-NACL 20-5-0.45 MEQ/L-%-% IV SOLN
INTRAVENOUS | Status: DC
  Administered 2013-09-22 – 2013-09-23 (×2): via INTRAVENOUS
  Administered 2013-09-23: 1000 mL via INTRAVENOUS
  Filled 2013-09-22 (×4): qty 1000

## 2013-09-22 MED ORDER — SUCCINYLCHOLINE CHLORIDE 20 MG/ML IJ SOLN
INTRAMUSCULAR | Status: DC | PRN
  Administered 2013-09-22: 100 mg via INTRAVENOUS

## 2013-09-22 MED ORDER — KETOROLAC TROMETHAMINE 30 MG/ML IJ SOLN
15.0000 mg | Freq: Once | INTRAMUSCULAR | Status: DC | PRN
  Filled 2013-09-22: qty 1

## 2013-09-22 MED ORDER — HYDROMORPHONE HCL PF 1 MG/ML IJ SOLN
INTRAMUSCULAR | Status: AC
  Filled 2013-09-22: qty 1

## 2013-09-22 MED ORDER — LIDOCAINE HCL (CARDIAC) 20 MG/ML IV SOLN
INTRAVENOUS | Status: DC | PRN
  Administered 2013-09-22: 100 mg via INTRAVENOUS

## 2013-09-22 MED ORDER — MIDAZOLAM HCL 5 MG/5ML IJ SOLN
INTRAMUSCULAR | Status: DC | PRN
  Administered 2013-09-22: 2 mg via INTRAVENOUS

## 2013-09-22 MED ORDER — GLYCOPYRROLATE 0.2 MG/ML IJ SOLN
INTRAMUSCULAR | Status: DC | PRN
  Administered 2013-09-22: .3 mg via INTRAVENOUS

## 2013-09-22 MED ORDER — FENTANYL CITRATE 0.05 MG/ML IJ SOLN
INTRAMUSCULAR | Status: DC | PRN
  Administered 2013-09-22 (×2): 50 ug via INTRAVENOUS
  Administered 2013-09-22 (×3): 100 ug via INTRAVENOUS
  Administered 2013-09-22: 50 ug via INTRAVENOUS
  Administered 2013-09-22: 100 ug via INTRAVENOUS
  Administered 2013-09-22 (×2): 50 ug via INTRAVENOUS
  Administered 2013-09-22: 100 ug via INTRAVENOUS

## 2013-09-22 MED ORDER — CISATRACURIUM BESYLATE (PF) 10 MG/5ML IV SOLN
INTRAVENOUS | Status: DC | PRN
  Administered 2013-09-22 (×2): 2 mg via INTRAVENOUS
  Administered 2013-09-22: 4 mg via INTRAVENOUS
  Administered 2013-09-22: 6 mg via INTRAVENOUS
  Administered 2013-09-22: 2 mg via INTRAVENOUS

## 2013-09-22 MED ORDER — KETOROLAC TROMETHAMINE 30 MG/ML IJ SOLN
15.0000 mg | Freq: Four times a day (QID) | INTRAMUSCULAR | Status: DC | PRN
  Administered 2013-09-22: 30 mg via INTRAVENOUS
  Filled 2013-09-22: qty 1

## 2013-09-22 MED ORDER — PROMETHAZINE HCL 25 MG/ML IJ SOLN: 6.2500 mg | INTRAMUSCULAR | Status: DC | PRN

## 2013-09-22 MED ORDER — HYDROMORPHONE HCL PF 1 MG/ML IJ SOLN
0.5000 mg | INTRAMUSCULAR | Status: DC | PRN
  Administered 2013-09-22 – 2013-09-23 (×5): 2 mg via INTRAVENOUS
  Administered 2013-09-23: 1 mg via INTRAVENOUS
  Administered 2013-09-23: 2 mg via INTRAVENOUS
  Filled 2013-09-22 (×2): qty 2
  Filled 2013-09-22: qty 1
  Filled 2013-09-22 (×4): qty 2

## 2013-09-22 MED ORDER — HYDROMORPHONE HCL PF 1 MG/ML IJ SOLN
0.2500 mg | INTRAMUSCULAR | Status: DC | PRN
  Administered 2013-09-22 (×4): 0.5 mg via INTRAVENOUS

## 2013-09-22 MED ORDER — LACTATED RINGERS IR SOLN
Status: DC | PRN
  Administered 2013-09-22: 3000 mL

## 2013-09-22 MED ORDER — CIPROFLOXACIN IN D5W 400 MG/200ML IV SOLN
400.0000 mg | Freq: Two times a day (BID) | INTRAVENOUS | Status: DC
  Administered 2013-09-22 – 2013-09-24 (×4): 400 mg via INTRAVENOUS
  Filled 2013-09-22 (×4): qty 200

## 2013-09-22 MED ORDER — BUPIVACAINE LIPOSOME 1.3 % IJ SUSP
20.0000 mL | Freq: Once | INTRAMUSCULAR | Status: DC
  Filled 2013-09-22: qty 20

## 2013-09-22 MED ORDER — HEPARIN SODIUM (PORCINE) 5000 UNIT/ML IJ SOLN
5000.0000 [IU] | Freq: Three times a day (TID) | INTRAMUSCULAR | Status: DC
  Administered 2013-09-22 – 2013-09-24 (×6): 5000 [IU] via SUBCUTANEOUS
  Filled 2013-09-22 (×9): qty 1

## 2013-09-22 MED ORDER — NEOSTIGMINE METHYLSULFATE 10 MG/10ML IV SOLN
INTRAVENOUS | Status: AC
  Filled 2013-09-22: qty 1

## 2013-09-22 MED ORDER — DEXAMETHASONE SODIUM PHOSPHATE 10 MG/ML IJ SOLN
INTRAMUSCULAR | Status: AC
  Filled 2013-09-22: qty 1

## 2013-09-22 SURGICAL SUPPLY — 45 items
APPLIER CLIP ROT 10 11.4 M/L (STAPLE) ×3
BENZOIN TINCTURE PRP APPL 2/3 (GAUZE/BANDAGES/DRESSINGS) IMPLANT
CABLE HIGH FREQUENCY MONO STRZ (ELECTRODE) ×3 IMPLANT
CATH REDDICK CHOLANGI 4FR 50CM (CATHETERS) ×3 IMPLANT
CLIP APPLIE ROT 10 11.4 M/L (STAPLE) ×1 IMPLANT
CLOSURE WOUND 1/2 X4 (GAUZE/BANDAGES/DRESSINGS)
COVER MAYO STAND STRL (DRAPES) ×3 IMPLANT
DECANTER SPIKE VIAL GLASS SM (MISCELLANEOUS) ×3 IMPLANT
DERMABOND ADVANCED (GAUZE/BANDAGES/DRESSINGS) ×2
DERMABOND ADVANCED .7 DNX12 (GAUZE/BANDAGES/DRESSINGS) ×1 IMPLANT
DEVICE SUTURE ENDOST 10MM (ENDOMECHANICALS) ×3 IMPLANT
DRAIN CHANNEL 19F RND (DRAIN) ×3 IMPLANT
DRAPE C-ARM 42X120 X-RAY (DRAPES) ×3 IMPLANT
DRAPE LAPAROSCOPIC ABDOMINAL (DRAPES) ×3 IMPLANT
ELECT REM PT RETURN 9FT ADLT (ELECTROSURGICAL) ×3
ELECTRODE REM PT RTRN 9FT ADLT (ELECTROSURGICAL) ×1 IMPLANT
ENDOSTITCH 0 SINGLE 48 (SUTURE) ×3 IMPLANT
EVACUATOR 1/8 PVC DRAIN (DRAIN) ×3 IMPLANT
GLOVE BIOGEL M 8.0 STRL (GLOVE) ×3 IMPLANT
GLOVE SURG SS PI 6.5 STRL IVOR (GLOVE) ×6 IMPLANT
GLOVE SURG SS PI 7.5 STRL IVOR (GLOVE) ×6 IMPLANT
GOWN SPEC L4 XLG W/TWL (GOWN DISPOSABLE) ×3 IMPLANT
GOWN STRL REUS W/TWL XL LVL3 (GOWN DISPOSABLE) ×12 IMPLANT
HEMOSTAT SURGICEL 4X8 (HEMOSTASIS) IMPLANT
IV CATH 14GX2 1/4 (CATHETERS) ×3 IMPLANT
KIT BASIN OR (CUSTOM PROCEDURE TRAY) ×3 IMPLANT
POUCH RETRIEVAL ECOSAC 10 (ENDOMECHANICALS) ×1 IMPLANT
POUCH RETRIEVAL ECOSAC 10MM (ENDOMECHANICALS) ×2
POUCH SPECIMEN RETRIEVAL 10MM (ENDOMECHANICALS) ×3 IMPLANT
SCISSORS LAP 5X45 EPIX DISP (ENDOMECHANICALS) ×3 IMPLANT
SCRUB PCMX 4 OZ (MISCELLANEOUS) ×3 IMPLANT
SET IRRIG TUBING LAPAROSCOPIC (IRRIGATION / IRRIGATOR) ×6 IMPLANT
SLEEVE XCEL OPT CAN 5 100 (ENDOMECHANICALS) ×6 IMPLANT
SOLUTION ANTI FOG 6CC (MISCELLANEOUS) ×3 IMPLANT
SPONGE DRAIN TRACH 4X4 STRL 2S (GAUZE/BANDAGES/DRESSINGS) ×3 IMPLANT
STRIP CLOSURE SKIN 1/2X4 (GAUZE/BANDAGES/DRESSINGS) IMPLANT
SUT VIC AB 4-0 SH 18 (SUTURE) ×3 IMPLANT
SYRINGE 20CC LL (MISCELLANEOUS) ×3 IMPLANT
TOWEL OR 17X26 10 PK STRL BLUE (TOWEL DISPOSABLE) ×3 IMPLANT
TOWEL OR NON WOVEN STRL DISP B (DISPOSABLE) ×3 IMPLANT
TRAY LAP CHOLE (CUSTOM PROCEDURE TRAY) ×3 IMPLANT
TROCAR BLADELESS OPT 5 100 (ENDOMECHANICALS) ×9 IMPLANT
TROCAR XCEL BLUNT TIP 100MML (ENDOMECHANICALS) IMPLANT
TROCAR XCEL NON-BLD 11X100MML (ENDOMECHANICALS) ×3 IMPLANT
TUBING INSUFFLATION 10FT LAP (TUBING) ×3 IMPLANT

## 2013-09-22 NOTE — Op Note (Signed)
Erik SallesMichael T [name] @date @  Procedure: Laparoscopic partial cholecystectomy with removal of impacted stone from infundibulum; ligation of infundibulum and placement of #19 Blake drain  Surgeon: Wenda LowMatt Martin, MD, FACS Asst:  none  Anes:  General  Drains: None  Findings: Severe acute and subacute cholecystitis with impacted stone in the infundibulum  Description of Procedure: The patient was taken to OR 1 and given general anesthesia.  The patient was prepped with PCMX and draped sterilely. A time out was performed.  Access to the abdomen was achieved with a 5 mm Optiview through the right upper quadrant. The gallbladder was walled off tenaciously with omentum..  Port placement includedHTN at the umbilicus for a 10 mm camera, the 25 mm laterally and another 11 in the midline upper 4 dissection..    The gallbladder was visualized and the fundus was grasped and the gallbladder was elevated only after I stripped away a lot of very dense adhesions stuck all across the top of the gallbladder.. Traction on the infundibulum did not allow for successful demonstration of the critical view. Inflammatory changes were most severe and after tedious dissection I determined that the common bile duct was likely stuck up to this inflammatory mass and an attempt to delineate the cystic duct would be hazardous to the common duct. I therefore elected to work at the top and free up the very top and opened and the gallbladder were encountered frank pus and some stony material. I then proceeded to do a HUMI cholecystectomy by excising anterior rim of the gallbladder using harmonic scalpel. I came down on the infundibulum to the beginning of the neck and found an impacted stone down in the down below. I was able to manipulate and gently compressed and that stone was delivered. It was probably about 1.5 cm in diameter. I crushed it and brought it out in pieces. The remnant had a little flash of bile and it and so I elected to oversew  this with a figure-of-eight suture using Endo Stitch and 0 Vicryl. The pieces of the gallbladder wall were removed and sent to pathology. The back wall of the gallbladder was cauterized with the electrocautery.  A 19 Blake per drain was brought in and placed out through the right side and was placed up in the gallbladder bed..       Incisions were injected with Exparel and closed with 4-0 Vicryl and Dermabond on the skin.  Sponge and needle count were correct.    The patient was taken to the recovery room in satisfactory condition.

## 2013-09-22 NOTE — Transfer of Care (Signed)
Immediate Anesthesia Transfer of Care Note  Patient: Erik [name]  Procedure(s) Performed: Procedure(s): LAPAROSCOPIC partial CHOLECYSTECTOMY (N/A)  Patient Location: PACU  Anesthesia Type:General  Level of Consciousness: awake, alert , oriented and patient cooperative  Airway & Oxygen Therapy: Patient Spontanous Breathing and Patient connected to face mask oxygen  Post-op Assessment: Report given to PACU RN, Post -op Vital signs reviewed and stable and Patient moving all extremities  Post vital signs: Reviewed and stable  Complications: No apparent anesthesia complications

## 2013-09-22 NOTE — Anesthesia Postprocedure Evaluation (Signed)
  Anesthesia Post-op Note  Patient: Erik [name]  Procedure(s) Performed: Procedure(s) (LRB): LAPAROSCOPIC partial CHOLECYSTECTOMY (N/A)  Patient Location: PACU  Anesthesia Type: General  Level of Consciousness: awake and alert   Airway and Oxygen Therapy: Patient Spontanous Breathing  Post-op Pain: mild  Post-op Assessment: Post-op Vital signs reviewed, Patient's Cardiovascular Status Stable, Respiratory Function Stable, Patent Airway and No signs of Nausea or vomiting  Last Vitals:  Filed Vitals:   09/22/13 1900  BP: 149/80  Pulse: 60  Temp: 36.6 C  Resp: 16    Post-op Vital Signs: stable   Complications: No apparent anesthesia complications

## 2013-09-22 NOTE — Anesthesia Procedure Notes (Signed)
Procedure Name: Intubation Date/Time: 09/22/2013 11:32 AM Performed by: Edison PaceGRAY, JOANNE E Pre-anesthesia Checklist: Patient identified, Timeout performed, Emergency Drugs available, Suction available and Patient being monitored Patient Re-evaluated:Patient Re-evaluated prior to inductionOxygen Delivery Method: Circle system utilized Preoxygenation: Pre-oxygenation with 100% oxygen (intubation/induction by K Wall CRNA) Intubation Type: IV induction Ventilation: Mask ventilation without difficulty Laryngoscope Size: Miller and 3 Grade View: Grade II Tube type: Oral Tube size: 7.5 mm Number of attempts: 1 Airway Equipment and Method: Stylet Placement Confirmation: ETT inserted through vocal cords under direct vision,  positive ETCO2 and breath sounds checked- equal and bilateral Secured at: 21 cm Tube secured with: Tape Dental Injury: Teeth and Oropharynx as per pre-operative assessment

## 2013-09-22 NOTE — Anesthesia Preprocedure Evaluation (Addendum)
Anesthesia Evaluation  Patient identified by MRN, date of birth, ID band Patient awake    Reviewed: Allergy & Precautions, H&P , NPO status , Patient's Chart, lab work & pertinent test results  Airway Mallampati: II TM Distance: >3 FB Neck ROM: Full    Dental  (+) Missing, Loose, Dental Advisory Given,    Pulmonary Current Smoker,  breath sounds clear to auscultation  Pulmonary exam normal       Cardiovascular negative cardio ROS  Rhythm:Regular Rate:Normal     Neuro/Psych negative neurological ROS  negative psych ROS   GI/Hepatic negative GI ROS, (+) Hepatitis -, C  Endo/Other  obesity  Renal/GU negative Renal ROS  negative genitourinary   Musculoskeletal negative musculoskeletal ROS (+)   Abdominal   Peds negative pediatric ROS (+)  Hematology negative hematology ROS (+)   Anesthesia Other Findings   Reproductive/Obstetrics negative OB ROS                          Anesthesia Physical Anesthesia Plan  ASA: III  Anesthesia Plan: General   Post-op Pain Management:    Induction: Intravenous  Airway Management Planned: Oral ETT  Additional Equipment:   Intra-op Plan:   Post-operative Plan: Extubation in OR  Informed Consent: I have reviewed the patients History and Physical, chart, labs and discussed the procedure including the risks, benefits and alternatives for the proposed anesthesia with the patient or authorized representative who has indicated his/her understanding and acceptance.   Dental advisory given  Plan Discussed with: CRNA and Surgeon  Anesthesia Plan Comments:        Anesthesia Quick Evaluation

## 2013-09-22 NOTE — Progress Notes (Signed)
UR Completed Brenda Graves-Bigelow, RN,BSN [phone]  

## 2013-09-23 LAB — CBC
HEMATOCRIT: 42.3 % (ref 39.0–52.0)
Hemoglobin: 14.1 g/dL (ref 13.0–17.0)
MCH: 29.3 pg (ref 26.0–34.0)
MCHC: 33.3 g/dL (ref 30.0–36.0)
MCV: 87.9 fL (ref 78.0–100.0)
Platelets: 171 10*3/uL (ref 150–400)
RBC: 4.81 MIL/uL (ref 4.22–5.81)
RDW: 13.2 % (ref 11.5–15.5)
WBC: 16.4 10*3/uL — ABNORMAL HIGH (ref 4.0–10.5)

## 2013-09-23 LAB — COMPREHENSIVE METABOLIC PANEL
ALK PHOS: 72 U/L (ref 39–117)
ALT: 40 U/L (ref 0–53)
AST: 19 U/L (ref 0–37)
Albumin: 3.4 g/dL — ABNORMAL LOW (ref 3.5–5.2)
BILIRUBIN TOTAL: 0.7 mg/dL (ref 0.3–1.2)
BUN: 8 mg/dL (ref 6–23)
CO2: 25 meq/L (ref 19–32)
Calcium: 9.3 mg/dL (ref 8.4–10.5)
Chloride: 103 mEq/L (ref 96–112)
Creatinine, Ser: 0.88 mg/dL (ref 0.50–1.35)
GLUCOSE: 122 mg/dL — AB (ref 70–99)
POTASSIUM: 4.1 meq/L (ref 3.7–5.3)
Sodium: 140 mEq/L (ref 137–147)
TOTAL PROTEIN: 6.9 g/dL (ref 6.0–8.3)

## 2013-09-23 MED ORDER — ZOLPIDEM TARTRATE 5 MG PO TABS
5.0000 mg | ORAL_TABLET | Freq: Every evening | ORAL | Status: DC | PRN
  Administered 2013-09-23: 5 mg via ORAL
  Filled 2013-09-23: qty 1

## 2013-09-23 MED ORDER — NICOTINE 14 MG/24HR TD PT24
14.0000 mg | MEDICATED_PATCH | Freq: Every day | TRANSDERMAL | Status: DC
  Administered 2013-09-23: 14 mg via TRANSDERMAL
  Filled 2013-09-23 (×2): qty 1

## 2013-09-23 NOTE — Progress Notes (Signed)
CARE MANAGEMENT NOTE 09/23/2013  Patient:  Erik [name],Erik [name]   Account Number:  [id number]  Date Initiated:  09/23/2013  Documentation initiated by:  [facility] Ps[facility]AVIS,Jashae Wiggs  Subjective/Objective Assessment:   Laparoscopic partial cholecystectomy     Action/Plan:   No NCM needs identified.   Anticipated DC Date:  09/24/2013   Anticipated DC Plan:  HOME/SELF CARE      DC Planning Services  CM consult      Choice offered to / List presented to:             Status of service:  Completed, signed off Medicare Important Message given?   (If response is "NO", the following Medicare IM given date fields will be blank) Date Medicare IM given:   Date Additional Medicare IM given:    Discharge Disposition:  HOME/SELF CARE  Per UR Regulation:    If discussed at Long Length of Stay Meetings, dates discussed:    Comments:  09/23/2013 1400 NCM spoke to pt and states he has orange card. [facility] follow [facility] with PCP. He is able to get meds with his orange card. Isidoro DonningAlesia Vergene Marland RN CCM Case Mgmt phone [phone]

## 2013-09-23 NOTE — Progress Notes (Signed)
Patient ID: Erik [name], male   DOB: 04-Oct-1965, 48 y.o.   MRN: [id number] [facility] Surgery Progress Note:   1 Day Post-Op  Subjective: Mental status is clear.   Objective: Vital signs in last 24 hours: Temp:  [97.4 F (36.3 C)-98.5 F (36.9 C)] 98.3 F (36.8 C) (06/20 0630) Pulse Rate:  [55-65] 60 (06/20 0630) Resp:  [15-18] 16 (06/20 0630) BP: (118-153)/(72-86) 144/84 mmHg (06/20 0630) SpO2:  [94 %-99 %] 96 % (06/20 0630)  Intake/Output from previous day: 06/19 0701 - 06/20 0700 In: 3145 [P.O.:120; I.V.:2825; IV Piggyback:200] Out: 2620 [Urine:2050; Drains:630; Blood:75] Intake/Output this shift:    Physical Exam: Work of breathing is normal.  JP serous.  Pain better.    Lab Results:  Results for orders placed during the hospital encounter of 09/21/13 (from the past 48 hour(s))  CBC WITH DIFFERENTIAL     Status: Abnormal   Collection Time    09/21/13  1:34 PM      Result Value Ref Range   WBC 13.0 (*) 4.0 - 10.5 K/uL   RBC 5.10  4.22 - 5.81 MIL/uL   Hemoglobin 15.4  13.0 - 17.0 g/dL   HCT 44.4  39.0 - 52.0 %   MCV 87.1  78.0 - 100.0 fL   MCH 30.2  26.0 - 34.0 pg   MCHC 34.7  30.0 - 36.0 g/dL   RDW 13.0  11.5 - 15.5 %   Platelets 163  150 - 400 K/uL   Neutrophils Relative % 86 (*) 43 - 77 %   Neutro Abs 11.1 (*) 1.7 - 7.7 K/uL   Lymphocytes Relative 9 (*) 12 - 46 %   Lymphs Abs 1.2  0.7 - 4.0 K/uL   Monocytes Relative 4  3 - 12 %   Monocytes Absolute 0.6  0.1 - 1.0 K/uL   Eosinophils Relative 1  0 - 5 %   Eosinophils Absolute 0.1  0.0 - 0.7 K/uL   Basophils Relative 0  0 - 1 %   Basophils Absolute 0.1  0.0 - 0.1 K/uL  COMPREHENSIVE METABOLIC PANEL     Status: Abnormal   Collection Time    09/21/13  1:34 PM      Result Value Ref Range   Sodium 139  137 - 147 mEq/L   Potassium 4.2  3.7 - 5.3 mEq/L   Chloride 104  96 - 112 mEq/L   CO2 23  19 - 32 mEq/L   Glucose, Bld 137 (*) 70 - 99 mg/dL   BUN 12  6 - 23 mg/dL   Creatinine, Ser 0.86  0.50 - 1.35  mg/dL   Calcium 9.7  8.4 - 10.5 mg/dL   Total Protein 7.8  6.0 - 8.3 g/dL   Albumin 3.9  3.5 - 5.2 g/dL   AST 35  0 - 37 U/L   ALT 58 (*) 0 - 53 U/L   Alkaline Phosphatase 89  39 - 117 U/L   Total Bilirubin 0.4  0.3 - 1.2 mg/dL   GFR calc non Af Amer >90  >90 mL/min   GFR calc Af Amer >90  >90 mL/min   Comment: (NOTE)     The eGFR has been calculated using the CKD EPI equation.     This calculation has not been validated in all clinical situations.     eGFR's persistently <90 mL/min signify possible Chronic Kidney     Disease.  LIPASE, BLOOD     Status: None  Collection Time    09/21/13  1:34 PM      Result Value Ref Range   Lipase 25  11 - 59 U/L  COMPREHENSIVE METABOLIC PANEL     Status: Abnormal   Collection Time    09/22/13  4:00 AM      Result Value Ref Range   Sodium 140  137 - 147 mEq/L   Potassium 3.9  3.7 - 5.3 mEq/L   Chloride 105  96 - 112 mEq/L   CO2 22  19 - 32 mEq/L   Glucose, Bld 113 (*) 70 - 99 mg/dL   BUN 8  6 - 23 mg/dL   Creatinine, Ser 0.82  0.50 - 1.35 mg/dL   Calcium 9.1  8.4 - 10.5 mg/dL   Total Protein 6.7  6.0 - 8.3 g/dL   Albumin 3.3 (*) 3.5 - 5.2 g/dL   AST 22  0 - 37 U/L   ALT 42  0 - 53 U/L   Alkaline Phosphatase 75  39 - 117 U/L   Total Bilirubin 0.4  0.3 - 1.2 mg/dL   GFR calc non Af Amer >90  >90 mL/min   GFR calc Af Amer >90  >90 mL/min   Comment: (NOTE)     The eGFR has been calculated using the CKD EPI equation.     This calculation has not been validated in all clinical situations.     eGFR's persistently <90 mL/min signify possible Chronic Kidney     Disease.  CBC     Status: Abnormal   Collection Time    09/22/13  4:00 AM      Result Value Ref Range   WBC 15.0 (*) 4.0 - 10.5 K/uL   RBC 4.90  4.22 - 5.81 MIL/uL   Hemoglobin 14.4  13.0 - 17.0 g/dL   HCT 42.9  39.0 - 52.0 %   MCV 87.6  78.0 - 100.0 fL   MCH 29.4  26.0 - 34.0 pg   MCHC 33.6  30.0 - 36.0 g/dL   RDW 13.3  11.5 - 15.5 %   Platelets 161  150 - 400 K/uL  SURGICAL  PCR SCREEN     Status: None   Collection Time    09/22/13  9:02 AM      Result Value Ref Range   MRSA, PCR NEGATIVE  NEGATIVE   Staphylococcus aureus NEGATIVE  NEGATIVE   Comment:            The Xpert SA Assay (FDA     approved for NASAL specimens     in patients over 29 years of age),     is one component of     a comprehensive surveillance     program.  Test performance has     been validated by [employer] for patients greater     than or equal to 42 year old.     It is not intended     to diagnose infection nor to     guide or monitor treatment.  COMPREHENSIVE METABOLIC PANEL     Status: Abnormal   Collection Time    09/23/13  4:17 AM      Result Value Ref Range   Sodium 140  137 - 147 mEq/L   Potassium 4.1  3.7 - 5.3 mEq/L   Chloride 103  96 - 112 mEq/L   CO2 25  19 - 32 mEq/L   Glucose, Bld 122 (*) 70 -  99 mg/dL   BUN 8  6 - 23 mg/dL   Creatinine, Ser 0.88  0.50 - 1.35 mg/dL   Calcium 9.3  8.4 - 10.5 mg/dL   Total Protein 6.9  6.0 - 8.3 g/dL   Albumin 3.4 (*) 3.5 - 5.2 g/dL   AST 19  0 - 37 U/L   ALT 40  0 - 53 U/L   Alkaline Phosphatase 72  39 - 117 U/L   Total Bilirubin 0.7  0.3 - 1.2 mg/dL   GFR calc non Af Amer >90  >90 mL/min   GFR calc Af Amer >90  >90 mL/min   Comment: (NOTE)     The eGFR has been calculated using the CKD EPI equation.     This calculation has not been validated in all clinical situations.     eGFR's persistently <90 mL/min signify possible Chronic Kidney     Disease.  CBC     Status: Abnormal   Collection Time    09/23/13  4:17 AM      Result Value Ref Range   WBC 16.4 (*) 4.0 - 10.5 K/uL   RBC 4.81  4.22 - 5.81 MIL/uL   Hemoglobin 14.1  13.0 - 17.0 g/dL   HCT 42.3  39.0 - 52.0 %   MCV 87.9  78.0 - 100.0 fL   MCH 29.3  26.0 - 34.0 pg   MCHC 33.3  30.0 - 36.0 g/dL   RDW 13.2  11.5 - 15.5 %   Platelets 171  150 - 400 K/uL    Radiology/Results: Dg Chest Port 1 View  09/22/2013   CLINICAL DATA:  Preoperative chest radiograph  for cholecystectomy. History of smoking.  EXAM: PORTABLE CHEST - 1 VIEW  COMPARISON:  Chest radiograph performed 03/24/2013  FINDINGS: The lungs are well-aerated. Mild vascular congestion is noted. There is no evidence of pleural effusion or pneumothorax.  The cardiomediastinal silhouette is borderline normal in size. No acute osseous abnormalities are seen.  IMPRESSION: Mild vascular congestion noted; lungs remain grossly clear.   Electronically Signed   By: Garald Balding M.D.   On: 09/22/2013 00:45    Anti-infectives: Anti-infectives   Start     Dose/Rate Route Frequency Ordered Stop   09/22/13 1800  ciprofloxacin (CIPRO) IVPB 400 mg     400 mg 200 mL/hr over 60 Minutes Intravenous Every 12 hours 09/22/13 1726     09/21/13 1630  ciprofloxacin (CIPRO) IVPB 400 mg  Status:  Discontinued     400 mg 200 mL/hr over 60 Minutes Intravenous Every 12 hours 09/21/13 1626 09/22/13 1506      Assessment/Plan: Problem List: Patient Active Problem List   Diagnosis Date Noted  . S/P laparoscopic partial cholecystectomy with removal of impacted stone and placement of a drain 09/22/2013  . Acute cholecystitis with chronic cholecystitis 08/12/2013    Advance diet.  Provide Nicoderm.  Hopeful discharge tomorrow.  1 Day Post-Op    LOS: 2 days   [name] Hassell Done, MD, [facility] [phone] beeper [phone]  09/23/2013 7:27 AM

## 2013-09-24 DIAGNOSIS — B1920 Unspecified viral hepatitis C without hepatic coma: Secondary | ICD-10-CM

## 2013-09-24 MED ORDER — OXYCODONE HCL 5 MG PO TABS: 5.0000 mg | ORAL_TABLET | ORAL | Status: DC | PRN

## 2013-09-24 NOTE — Discharge Summary (Signed)
Physician Discharge Summary  Patient ID: Erik KrabbeMichael T [name] MRN: [id number] DOB/AGE: [birth year] 47 y.o.  Admit date: 09/21/2013 Discharge date: 09/24/2013  Admission Diagnoses:  Subacute and chronic cholecystitis  Discharge Diagnoses:  same  Active Problems:   S/P laparoscopic partial cholecystectomy with removal of impacted stone and placement of a drain   Hepatitis-C   Surgery:  Laparoscopic partial cholecystectomy   Discharged Condition: improved  Hospital Course:   Admitted and taken to OR.  Gallbladder was walled off with chronic severe adhesions.  The impacted stone was removed and the anterior wall of the gallbladder removed.  A drain was left which he will go home with that does not have any bile in it.  Ready for discharge.  In discharge planning he mentioned that he had hepatitis C and this was added to the problem list.   Consults: none  Significant Diagnostic Studies: none    Discharge Exam: Blood pressure 128/70, pulse 59, temperature 98.3 F (36.8 C), temperature source Oral, resp. rate 16, height 6' (1.829 m), weight 250 lb (113.399 kg), SpO2 97.00%. Pain controlled.  JP with serosanguinous drainage  Disposition: ED Dismiss - Never Arrived  Discharge Instructions   Diet - low sodium heart healthy    Complete by:  As directed      Discharge instructions    Complete by:  As directed   Empty JP at least twice a day and record     Increase activity slowly    Complete by:  As directed             Medication List         citalopram 20 MG tablet  Commonly known as:  CELEXA  Take 20 mg by mouth daily.     oxyCODONE 5 MG immediate release tablet  Commonly known as:  Oxy IR/ROXICODONE  Take 1-2 tablets (5-10 mg total) by mouth every 4 (four) hours as needed for moderate pain, severe pain or breakthrough pain.     ranitidine 150 MG tablet  Commonly known as:  ZANTAC  Take 150 mg by mouth 2 (two) times daily.           Follow-up Information   Follow up  with Valarie MerinoMARTIN,MATTHEW B, MD In 2 days. (keep that previously made appt)    Specialty:  General Surgery   Contact information:   [address] [place] [address] [phone]       Signed: Valarie MerinoMARTIN,MATTHEW B 09/24/2013, 8:15 AM

## 2013-09-24 NOTE — Discharge Instructions (Signed)
Hepatitis C  Hepatitis C is a viral infection of the liver. Infection may go undetected for months or years because symptoms may be absent or very mild. Chronic liver disease is the main danger of hepatitis C. This may lead to scarring of the liver (cirrhosis), liver failure, and liver cancer.  CAUSES   Hepatitis C is caused by the hepatitis C virus (HCV). Formerly, hepatitis C infections were most commonly transmitted through blood transfusions. In the early 1990s, routine testing of donated blood for hepatitis C and exclusion of blood that tests positive for HCV began. Now, HCV is most commonly transmitted from person to person through injection drug use, sharing needles, or sex with an infected person. A caregiver may also get the infection from exposure to the blood of an infected patient by way of a cut or needle stick.   SYMPTOMS   Acute Phase  Many cases of acute HCV infection are mild and cause few problems. Some people may not even realize they are sick. Symptoms in others may last a few weeks to several months and include:  · Feeling very tired.  · Loss of appetite.  · Nausea.  · Vomiting.  · Abdominal pain.  · Dark yellow urine.  · Yellow skin and eyes (jaundice).  · Itching of the skin.  Chronic Phase  · Between 50% to 85% of people who get HCV infection become "chronic carriers." They often have no symptoms, but the virus stays in their body. They may spread the virus to others and can get long-term liver disease.  · Many people with chronic HCV infection remain healthy for many years. However, up to 1 in 5 chronically infected people may develop severe liver diseases including scarring of the liver (cirrhosis), liver failure, or liver cancer.  DIAGNOSIS   Diagnosis of hepatitis C infection is made by testing blood for the presence of hepatitis C viral particles called RNA. Other tests may also be done to measure the status of current liver function, exclude other liver problems, or assess liver  damage.  TREATMENT   Treatment with many antiviral drugs is available and recommended for some patients with chronic HCV infection. Drug treatment is generally considered appropriate for patients who:  · Are 18 years of age or older.  · Have a positive test for HCV particles in the blood.  · Have a liver tissue sample (biopsy) that shows chronic hepatitis and significant scarring (fibrosis).  · Do not have signs of liver failure.  · Have acceptable blood test results that confirm the wellness of other body organs.  · Are willing to be treated and conform to treatment requirements.  · Have no other circumstances that would prevent treatment from being recommended (contraindications).  All people who are offered and choose to receive drug treatment must understand that careful medical follow up for many months and even years is crucial in order to make successful care possible. The goal of drug treatment is to eliminate any evidence of HCV in the blood on a long-term basis. This is called a "sustained virologic response" or SVR. Achieving a SVR is associated with a decrease in the chance of life-threatening liver problems, need for a liver transplant, liver cancer rates, and liver-related complications.  Successful treatment currently requires taking treatment drugs for at least 24 weeks and up to 72 weeks. An injected drug (interferon) given weekly and an oral antiviral medicine taken daily are usually prescribed. Side effects from these drugs are common and some may be very   serious. Your response to treatment must be carefully monitored by both you and your caregiver throughout the entire treatment period.  PREVENTION  There is no vaccine for hepatitis C. The only way to prevent the disease is to reduce the risk of exposure to the virus.   · Avoid sharing drug needles or personal items like toothbrushes, razors, and nail clippers with an infected person.  · Healthcare workers need to avoid injuries and wear  appropriate protective equipment such as gloves, gowns, and face masks when performing invasive medical or nursing procedures.  HOME CARE INSTRUCTIONS   To avoid making your liver disease worse:  · Strictly avoid drinking alcohol.  · Carefully review all new prescriptions of medicines with your caregiver. Ask your caregiver which drugs you should avoid. The following drugs are toxic to the liver, and your caregiver may tell you to avoid them:  ¨ Isoniazid.  ¨ Methyldopa.  ¨ Acetaminophen.  ¨ Anabolic steroids (muscle-building drugs).  ¨ Erythromycin.  ¨ Oral contraceptives (birth control pills).  · Check with your caregiver to make sure medicine you are currently taking will not be harmful.  · Periodic blood tests may be required. Follow your caregiver's advice about when you should have blood tests.  · Avoid a sexual relationship until advised otherwise by your caregiver.  · Avoid activities that could expose other people to your blood. Examples include sharing a toothbrush, nail clippers, razors, and needles.  · Bed rest is not necessary, but it may make you feel better. Recovery time is not related to the amount of rest you receive.  · This infection is contagious. Follow your caregiver's instructions in order to avoid spread of the infection.  SEEK IMMEDIATE MEDICAL CARE IF:  · You have increasing fatigue or weakness.  · You have an oral temperature above 102° F (38.9° C), not controlled by medicine.  · You develop loss of appetite, nausea, or vomiting.  · You develop jaundice.  · You develop easy bruising or bleeding.  · You develop any severe problems as a result of your treatment.  MAKE SURE YOU:   · Understand these instructions.  · Will watch your condition.  · Will get help right away if you are not doing well or get worse.  Document Released: 03/20/2000 Document Revised: 06/15/2011 Document Reviewed: 07/23/2010  ExitCare® Patient Information ©2015 [employer]. This information is not intended to replace  advice given to you by your health care provider. Make sure you discuss any questions you have with your health care provider.

## 2013-09-24 NOTE — Progress Notes (Signed)
Reviewed discharge paperwork and procedure for emptying JP drain with patient, patient verbalizes understanding.  MD already gave patient prescription for pain medication.  Patient left floor with NT via wheelchair to d/c home.  Dorothyann PengKim Kimbrell RN

## 2013-09-25 ENCOUNTER — Encounter (HOSPITAL_COMMUNITY): Payer: Self-pay | Admitting: Surgery

## 2013-09-26 ENCOUNTER — Encounter (INDEPENDENT_AMBULATORY_CARE_PROVIDER_SITE_OTHER): Payer: Self-pay | Admitting: Surgery

## 2013-09-26 ENCOUNTER — Ambulatory Visit (INDEPENDENT_AMBULATORY_CARE_PROVIDER_SITE_OTHER): Payer: Self-pay | Admitting: Surgery

## 2013-09-26 VITALS — BP 118/75 | HR 80 | Temp 98.2°F | Resp 16 | Ht 72.0 in | Wt 242.2 lb

## 2013-09-26 DIAGNOSIS — Z9889 Other specified postprocedural states: Secondary | ICD-10-CM

## 2013-09-26 DIAGNOSIS — Z9049 Acquired absence of other specified parts of digestive tract: Secondary | ICD-10-CM

## 2013-09-26 NOTE — Progress Notes (Signed)
Erik SallesMichael T Kloss 48 y.o.  Body mass index is 32.84 kg/(m^2).  Patient Active Problem List   Diagnosis Date Noted  . Hepatitis-C 09/24/2013  . S/P laparoscopic partial cholecystectomy with removal of impacted stone and placement of a drain 09/22/2013    No Known Allergies    Past Surgical History  Procedure Laterality Date  . Back surgery    . Shoulder surgery    . Salivary gland surgery    . Cholecystectomy N/A 09/22/2013    Procedure: LAPAROSCOPIC partial CHOLECYSTECTOMY;  Surgeon: Valarie MerinoMatthew B Martin, MD;  Location: [facility];  Service: General;  Laterality: N/A;   TALBOT, DAVID C, MD No diagnosis found.  The incisions look fine and his drain has serosanguineous drainage. I went ahead and removed his 19 [address] drain. There was no bilel in it.Path showed necrotic gallbladder.  Will see back in 6 weeks.  [name] Daphine DeutscherMartin, MD, [facility]  [facility] [phone] beeper [phone]  09/26/2013 3:58 PM

## 2013-10-13 ENCOUNTER — Encounter (INDEPENDENT_AMBULATORY_CARE_PROVIDER_SITE_OTHER): Payer: Self-pay | Admitting: Surgery

## 2013-11-17 ENCOUNTER — Encounter (INDEPENDENT_AMBULATORY_CARE_PROVIDER_SITE_OTHER): Payer: Self-pay | Admitting: Surgery

## 2013-11-27 ENCOUNTER — Encounter (INDEPENDENT_AMBULATORY_CARE_PROVIDER_SITE_OTHER): Payer: Self-pay | Admitting: Surgery

## 2017-01-01 ENCOUNTER — Emergency Department (HOSPITAL_BASED_OUTPATIENT_CLINIC_OR_DEPARTMENT_OTHER)
Admission: EM | Admit: 2017-01-01 | Discharge: 2017-01-01 | Disposition: A | Discharge status: Home / Self Care | Payer: Self-pay | Attending: Emergency Medicine | Admitting: Emergency Medicine

## 2017-01-01 DIAGNOSIS — F11188 Opioid abuse with other opioid-induced disorder: Secondary | ICD-10-CM | POA: Insufficient documentation

## 2017-01-01 DIAGNOSIS — F17210 Nicotine dependence, cigarettes, uncomplicated: Secondary | ICD-10-CM | POA: Insufficient documentation

## 2017-01-01 DIAGNOSIS — T401X1A Poisoning by heroin, accidental (unintentional), initial encounter: Secondary | ICD-10-CM | POA: Insufficient documentation

## 2017-01-01 DIAGNOSIS — Z79899 Other long term (current) drug therapy: Secondary | ICD-10-CM | POA: Insufficient documentation

## 2017-01-01 LAB — COMPREHENSIVE METABOLIC PANEL
ALT: 64 U/L — ABNORMAL HIGH (ref 17–63)
ANION GAP: 7 (ref 5–15)
AST: 42 U/L — ABNORMAL HIGH (ref 15–41)
Albumin: 3.9 g/dL (ref 3.5–5.0)
Alkaline Phosphatase: 89 U/L (ref 38–126)
BILIRUBIN TOTAL: 0.6 mg/dL (ref 0.3–1.2)
BUN: 10 mg/dL (ref 6–20)
CO2: 23 mmol/L (ref 22–32)
Calcium: 8.8 mg/dL — ABNORMAL LOW (ref 8.9–10.3)
Chloride: 106 mmol/L (ref 101–111)
Creatinine, Ser: 0.82 mg/dL (ref 0.61–1.24)
GFR calc Af Amer: >60 mL/min (ref >60)
GFR calc non Af Amer: >60 mL/min (ref >60)
Glucose, Bld: 146 mg/dL — ABNORMAL HIGH (ref 65–99)
Potassium: 3.5 mmol/L (ref 3.5–5.1)
Sodium: 136 mmol/L (ref 135–145)
TOTAL PROTEIN: 7.6 g/dL (ref 6.5–8.1)

## 2017-01-01 LAB — CBC WITH DIFFERENTIAL/PLATELET
Basophils Absolute: 0 10*3/uL (ref 0.0–0.1)
Basophils Relative: 1 %
EOS PCT: 4 %
Eosinophils Absolute: 0.3 10*3/uL (ref 0.0–0.7)
HEMATOCRIT: 44.4 % (ref 39.0–52.0)
Hemoglobin: 15.3 g/dL (ref 13.0–17.0)
Lymphocytes Relative: 14 %
Lymphs Abs: 1.2 10*3/uL (ref 0.7–4.0)
MCH: 29.4 pg (ref 26.0–34.0)
MCHC: 34.5 g/dL (ref 30.0–36.0)
MCV: 85.4 fL (ref 78.0–100.0)
MONO ABS: 0.6 10*3/uL (ref 0.1–1.0)
MONOS PCT: 7 %
Neutro Abs: 6.6 10*3/uL (ref 1.7–7.7)
Neutrophils Relative %: 74 %
PLATELETS: 131 10*3/uL — AB (ref 150–400)
RBC: 5.2 MIL/uL (ref 4.22–5.81)
RDW: 12.7 % (ref 11.5–15.5)
WBC: 8.7 10*3/uL (ref 4.0–10.5)

## 2017-01-01 LAB — RAPID URINE DRUG SCREEN, HOSP PERFORMED
Amphetamines: NOT DETECTED
BENZODIAZEPINES: NOT DETECTED
Barbiturates: NOT DETECTED
Cocaine: NOT DETECTED
Opiates: POSITIVE — AB
Tetrahydrocannabinol: POSITIVE — AB

## 2017-01-01 LAB — ACETAMINOPHEN LEVEL: Acetaminophen (Tylenol), Serum: <10 ug/mL — ABNORMAL LOW (ref 10–30)

## 2017-01-01 LAB — ETHANOL

## 2017-01-01 LAB — SALICYLATE LEVEL: Salicylate Lvl: <7 mg/dL (ref 2.8–30.0)

## 2017-01-01 NOTE — ED Notes (Signed)
Pt requesting phone to talk with family. Phone provided.

## 2017-01-01 NOTE — ED Notes (Signed)
[place] [employer] at bedside with patient.

## 2017-01-01 NOTE — ED Notes (Signed)
Consent obtained from patient to obtain blood specimen for [employer]

## 2017-01-01 NOTE — ED Notes (Signed)
Pt requesting detox from heroin at this time.

## 2017-01-01 NOTE — ED Provider Notes (Signed)
[facility] Provider Note   CSN: [id number] Arrival date & time: 01/01/17  0919     History   Chief Complaint Chief Complaint  Patient presents with  . Drug Overdose  . Addiction Problem    HPI Erik [name] is a 51 y.o. male.  The history is provided by the patient and medical records.    51 year old male with history of chronic back pain, depression, hepatitis C, presenting to the ED after overdose. Patient was found in a parked car this morning, apparently overdosed on heroin. Bystander gave him intranasal Narcan with resolution. He is awake, alert, appropriately oriented on arrival here. He denies any attempt at self-harm. He denies any suicidal or homicidal ideation. No hallucinations. 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 States he has "been going through some stuff" and he is using it to cope.  He does have family in the area, does not necessarily have a close relationship with them. He denies any other illicit drug use. No alcohol use. Patient is desiring detox.  Past Medical History:  Diagnosis Date  . Chronic back pain   . Depression   . Hepatitis C     Patient Active Problem List   Diagnosis Date Noted  . Hepatitis-C 09/24/2013  . S/P laparoscopic partial cholecystectomy with removal of impacted stone and placement of a drain 09/22/2013    Past Surgical History:  Procedure Laterality Date  . BACK SURGERY    . CHOLECYSTECTOMY N/A 09/22/2013   Procedure: LAPAROSCOPIC partial CHOLECYSTECTOMY;  Surgeon: Valarie Merino, MD;  Location: [facility];  Service: General;  Laterality: N/A;  . SALIVARY GLAND SURGERY    . SHOULDER SURGERY         Home Medications    Prior to Admission medications   Medication Sig Start Date End Date Taking? Authorizing Provider  citalopram (CELEXA) 20 MG tablet Take 20 mg by mouth daily.    [provider]  ranitidine (ZANTAC) 150 MG tablet Take 150 mg by mouth 2 (two) times  daily.    [provider]    Family History No family history on file.  Social History Social History  Substance Use Topics  . Smoking status: Current Some Day Smoker    Packs/day: 1.00    Types: Cigarettes  . Smokeless tobacco: Never Used  . Alcohol use Yes     Comment: 2 beers weekly     Allergies   Patient has no known allergies.   Review of Systems Review of Systems  Psychiatric/Behavioral:       OD  All other systems reviewed and are negative.    Physical Exam Updated Vital Signs BP (!) 135/91 (BP Location: Right Arm)   Pulse 77   Temp 98.2 F (36.8 C) (Oral)   Resp 16   Ht 6' (1.829 m)   Wt 136.1 kg (300 lb)   SpO2 98%   BMI 40.69 kg/m   Physical Exam  Constitutional: He is oriented to person, place, and time. He appears well-developed and well-nourished.  HENT:  Head: Normocephalic and atraumatic.  Mouth/Throat: Oropharynx is clear and moist.  Eyes: Pupils are equal, round, and reactive to light. Conjunctivae and EOM are normal.  Neck: Normal range of motion.  Cardiovascular: Normal rate, regular rhythm and normal heart sounds.   Pulmonary/Chest: Effort normal and breath sounds normal. No respiratory distress. He has no wheezes.  Abdominal: Soft. Bowel sounds are normal. There is no  tenderness. There is no rebound.  Musculoskeletal: Normal range of motion.  Neurological: He is alert and oriented to person, place, and time.  Skin: Skin is warm and dry.  Psychiatric: He has a normal mood and affect. He is not actively hallucinating. He expresses no homicidal and no suicidal ideation. He expresses no suicidal plans and no homicidal plans.  Nursing note and vitals reviewed.    ED Treatments / Results  Labs (all labs ordered are listed, but only abnormal results are displayed) Labs Reviewed  CBC WITH DIFFERENTIAL/PLATELET - Abnormal; Notable for the following:       Result Value   Platelets 131 (*)    All other components within normal  limits  COMPREHENSIVE METABOLIC PANEL - Abnormal; Notable for the following:    Glucose, Bld 146 (*)    Calcium 8.8 (*)    AST 42 (*)    ALT 64 (*)    All other components within normal limits  RAPID URINE DRUG SCREEN, HOSP PERFORMED - Abnormal; Notable for the following:    Opiates POSITIVE (*)    Tetrahydrocannabinol POSITIVE (*)    All other components within normal limits  ACETAMINOPHEN LEVEL - Abnormal; Notable for the following:    Acetaminophen (Tylenol), Serum <10 (*)    All other components within normal limits  ETHANOL  SALICYLATE LEVEL    EKG  EKG Interpretation None       Radiology No results found.  Procedures Procedures (including critical care time)  Medications Ordered in ED Medications - No data to display   Initial Impression / Assessment and Plan / ED Course  I have reviewed the triage vital signs and the nursing notes.  Pertinent labs & imaging results that were available during my care of the patient were reviewed by me and considered in my medical decision making (see chart for details).  51 year old male here after heroin overdose. He was found by bystander and given intranasal Narcan. He is awake, alert, appropriately oriented on arrival here. He has no physical complaints at this time. He adamantly denies that this was a suicide attempt. He has not had any homicidal ideation, hallucinations, or suicidal thoughts. Screening labs obtained, UDS positive for opiates and THC. Mild elevation of LFTs, otherwise reassuring.  Patient has remained stable here. Remains without any active complaints. Continues to deny any suicidal or homicidal thoughts. He does not appear to be a danger to himself or others at this time. Tolerating oral fluids well. At this time, feel he is stable for discharge. He is desiring detox, I given him outpatient resources in which to follow-up.  Discussed plan with patient, he acknowledged understanding and agreed with plan of care.   Return precautions given for new or worsening symptoms.  Final Clinical Impressions(s) / ED Diagnoses   Final diagnoses:  Accidental overdose of heroin, initial encounter [facility]    New Prescriptions New Prescriptions   No medications on file     Oletha Blend 01/01/17 1217    Tegeler, Canary Brim, MD 01/01/17 [phone]

## 2017-01-01 NOTE — Discharge Instructions (Signed)
Your labs today looked good. You can follow-up with one of the detox facilities on the resource guide attached on back. You can follow-up with your primary care doctor. Return here for any new/worsening symptoms.

## 2017-01-01 NOTE — ED Triage Notes (Signed)
Pt brought in by EMS for overdose of IV heroin. Bystander gave intranasal narcan, . Pt alert & oriented upon arrival by EMS. Pt reports daily use x "couple of years." Pt requests detox at this time. Pt denies SI/HI.

## 2017-01-01 NOTE — ED Notes (Signed)
Pt tearful, still talking on the phone.

## 2017-11-12 ENCOUNTER — Emergency Department (HOSPITAL_BASED_OUTPATIENT_CLINIC_OR_DEPARTMENT_OTHER)
Admission: EM | Admit: 2017-11-12 | Discharge: 2017-11-12 | Disposition: A | Discharge status: Home / Self Care | Payer: Medicaid Other | Attending: Emergency Medicine | Admitting: Emergency Medicine

## 2017-11-12 ENCOUNTER — Emergency Department (HOSPITAL_BASED_OUTPATIENT_CLINIC_OR_DEPARTMENT_OTHER): Payer: Medicaid Other

## 2017-11-12 ENCOUNTER — Other Ambulatory Visit: Payer: Self-pay

## 2017-11-12 ENCOUNTER — Encounter (HOSPITAL_BASED_OUTPATIENT_CLINIC_OR_DEPARTMENT_OTHER): Payer: Self-pay | Admitting: *Deleted

## 2017-11-12 DIAGNOSIS — N50811 Right testicular pain: Secondary | ICD-10-CM | POA: Insufficient documentation

## 2017-11-12 DIAGNOSIS — N50819 Testicular pain, unspecified: Secondary | ICD-10-CM

## 2017-11-12 DIAGNOSIS — N5089 Other specified disorders of the male genital organs: Secondary | ICD-10-CM | POA: Insufficient documentation

## 2017-11-12 DIAGNOSIS — Z79899 Other long term (current) drug therapy: Secondary | ICD-10-CM | POA: Insufficient documentation

## 2017-11-12 DIAGNOSIS — F17210 Nicotine dependence, cigarettes, uncomplicated: Secondary | ICD-10-CM | POA: Insufficient documentation

## 2017-11-12 LAB — URINALYSIS, ROUTINE W REFLEX MICROSCOPIC
Bilirubin Urine: NEGATIVE
Glucose, UA: NEGATIVE mg/dL
Ketones, ur: NEGATIVE mg/dL
Nitrite: NEGATIVE
Protein, ur: NEGATIVE mg/dL
Specific Gravity, Urine: 1.02 (ref 1.005–1.030)
pH: 6 (ref 5.0–8.0)

## 2017-11-12 LAB — URINALYSIS, MICROSCOPIC (REFLEX)

## 2017-11-12 MED ORDER — LEVOFLOXACIN 500 MG PO TABS: 500.0000 mg | ORAL_TABLET | Freq: Every day | ORAL | 0 refills | Status: AC

## 2017-11-12 MED ORDER — CEFTRIAXONE SODIUM 250 MG IJ SOLR
250.0000 mg | Freq: Once | INTRAMUSCULAR | Status: AC
  Administered 2017-11-12: 250 mg via INTRAMUSCULAR
  Filled 2017-11-12: qty 250

## 2017-11-12 MED ORDER — LIDOCAINE HCL (PF) 1 % IJ SOLN
INTRAMUSCULAR | Status: AC
  Administered 2017-11-12: 1.2 mL
  Filled 2017-11-12: qty 5

## 2017-11-12 MED ORDER — AZITHROMYCIN 250 MG PO TABS
1000.0000 mg | ORAL_TABLET | Freq: Once | ORAL | Status: AC
  Administered 2017-11-12: 1000 mg via ORAL
  Filled 2017-11-12: qty 4

## 2017-11-12 NOTE — Discharge Instructions (Addendum)
Ultrasound shows that your testicle is not twisted (or torsed). Urine did reveal a UTI and I will give you antibiotics for that. Take the Levaquin for the full seven (7) days.   Follow-up if you experience any of the following symptoms: fever, abdominal pain and change in bowel movements (constipation, diarrhea, blood in stool), difficulty/burning/hesitancy with urination.  If the swelling persists after the antibiotics are completed, please follow-up with your PCP for further discussion.

## 2017-11-12 NOTE — ED Notes (Signed)
ED Provider at bedside. 

## 2017-11-12 NOTE — ED Triage Notes (Addendum)
Swelling and pain in his right testicle x 2 days. Symptoms started after lifting a refrigerator.

## 2017-11-12 NOTE — ED Provider Notes (Signed)
[facility] EMERGENCY DEPARTMENT Provider Note  CSN: [id number] Arrival date & time: 11/12/17  1113   History   Chief Complaint Chief Complaint  Patient presents with  . Testicle Pain    HPI Erik [name] is a 52 y.o. male with a medical history of Hepatitis C and chronic back pain who presented to the ED for right testicle pain x2 days. Patient also reports right testicle swelling that began after he was helping lift a refrigerator. Denies fever, fatigue, abdominal pain, N/V, changes in bowels, dysuria, hematuria, urgency or frequency. He describes the pain as pressure and constant. No aggravating or relieving symptoms. Patient has tried nothing prior to coming to the ED.  Past Medical History:  Diagnosis Date  . Chronic back pain   . Depression   . Hepatitis C     Patient Active Problem List   Diagnosis Date Noted  . Hepatitis-C 09/24/2013  . S/P laparoscopic partial cholecystectomy with removal of impacted stone and placement of a drain 09/22/2013    Past Surgical History:  Procedure Laterality Date  . BACK SURGERY    . CHOLECYSTECTOMY N/A 09/22/2013   Procedure: LAPAROSCOPIC partial CHOLECYSTECTOMY;  Surgeon: Valarie Merino, MD;  Location: [facility];  Service: General;  Laterality: N/A;  . SALIVARY GLAND SURGERY    . SHOULDER SURGERY          Home Medications    Prior to Admission medications   Medication Sig Start Date End Date Taking? Authorizing Provider  citalopram (CELEXA) 20 MG tablet Take 20 mg by mouth daily.    [provider]  levofloxacin (LEVAQUIN) 500 MG tablet Take 1 tablet (500 mg total) by mouth daily. 11/12/17   Juliahna Wiswell, Jerrel Ivory I, PA-C  ranitidine (ZANTAC) 150 MG tablet Take 150 mg by mouth 2 (two) times daily.    [provider]    Family History No family history on file.  Social History Social History   Tobacco Use  . Smoking status: Current Some Day Smoker    Packs/day: 1.00    Types: Cigarettes  . Smokeless  tobacco: Never Used  Substance Use Topics  . Alcohol use: Not Currently    Comment: 2 beers weekly  . Drug use: Not Currently     Allergies   Patient has no known allergies.   Review of Systems Review of Systems  Constitutional: Negative for chills and fever.  Gastrointestinal: Negative for abdominal pain, blood in stool, constipation, diarrhea, nausea and vomiting.  Genitourinary: Positive for scrotal swelling and testicular pain. Negative for decreased urine volume, difficulty urinating, discharge, dysuria, flank pain, frequency, hematuria and urgency.  Musculoskeletal: Negative.   Skin: Negative.      Physical Exam Updated Vital Signs BP 126/83 (BP Location: Left Arm)   Pulse 79   Temp 98.1 F (36.7 C) (Oral)   Resp 18   Ht 6' (1.829 m)   Wt (!) 141.8 kg   SpO2 99%   BMI 42.40 kg/m   Physical Exam  Constitutional: Vital signs are normal. He is cooperative. He does not have a sickly appearance.  Obese  Abdominal: Soft. Normal appearance and bowel sounds are normal. There is no tenderness. No hernia.  Difficult to appreciate inguinal hernia given pt's body habitus.  Genitourinary: Penis normal. Right testis shows swelling and tenderness. Uncircumcised. No penile erythema or penile tenderness. No discharge found.  Lymphadenopathy: No inguinal adenopathy noted on the right or left side.  Neurological: He is alert.  Skin: Skin is  warm and intact. He is not diaphoretic. No pallor.  Nursing note and vitals reviewed.    ED Treatments / Results  Labs (all labs ordered are listed, but only abnormal results are displayed) Labs Reviewed  URINALYSIS, ROUTINE W REFLEX MICROSCOPIC - Abnormal; Notable for the following components:      Result Value   Hgb urine dipstick TRACE (*)    Leukocytes, UA TRACE (*)    All other components within normal limits  URINALYSIS, MICROSCOPIC (REFLEX) - Abnormal; Notable for the following components:   Bacteria, UA MANY (*)    All other  components within normal limits  URINE CULTURE  GC/CHLAMYDIA PROBE AMP ([place]) NOT AT [facility]    EKG None  Radiology US Scrotum W/doppler  Result Date: 11/12/2017 CLINICAL DATA:  Right testicular swelling and pain for 3 days. EXAM: SCROTAL ULTRASOUND DOPPLER ULTRASOUND OF THE TESTICLES TECHNIQUE: Complete ultrasound examination of the testicles, epididymis, and other scrotal structures was performed. Color and spectral Doppler ultrasound were also utilized to evaluate blood flow to the testicles. COMPARISON:  None. FINDINGS: Right testicle Measurements: 4.6 x 2.5 x 3.3 cm. No mass or microlithiasis visualized. Left testicle Measurements: 4.3 x 2.8 x 3.3 cm. No mass or microlithiasis visualized. Right epididymis:  0.4 cm epididymal cyst is noted. Left epididymis:  Normal in size and appearance. Hydrocele:  There are bilateral hydroceles, right greater than left. Varicocele:  Right varicocele is noted. Pulsed Doppler interrogation of both testes demonstrates normal low resistance arterial and venous waveforms bilaterally. IMPRESSION: Normal bilateral testis. Bilateral hydroceles, right greater than left. Right varicocele. Small right epididymal cyst. Electronically Signed   By: Sherian Rein M.D.   On: 11/12/2017 15:51    Procedures Procedures (including critical care time)  Medications Ordered in ED Medications  cefTRIAXone (ROCEPHIN) injection 250 mg (250 mg Intramuscular Given 11/12/17 1623)  azithromycin (ZITHROMAX) tablet 1,000 mg (1,000 mg Oral Given 11/12/17 1626)  lidocaine (PF) (XYLOCAINE) 1 % injection (1.2 mLs  Given 11/12/17 1625)     Initial Impression / Assessment and Plan / ED Course  Triage vital signs and the nursing notes have been reviewed.  Pertinent labs & imaging results that were available during care of the patient were reviewed and considered in medical decision making (see chart for details).  Patient presents with right testicle pain x2 days after heavy lifting.  There are no other associated symptoms. Physical exam is significant for anterior swelling on the right side with his testicle palpated posteriorly and in proper orientation. However, appreciation of hernia is difficult to discern due to patient's body habitus. Will order ultrasound with Doppler to formally evaluate for torsion.  Clinical Course as of Nov 13 1627  Fri Nov 12, 2017  1432 Bedside ultrasound completed by this provider. No free fluid in scrotum visualized.   [GM]  1439 UA suggestive of UTI. Possible dx of epididymitis  and will order urine culture + gonorrhea/chlamydia.   [GM]  1602 U/S scrotum with doppler showed normal testes. No signs of torsion. Variocele appreciated which could be contributing to swelling. Will treat for UTI and give return precautions.   [GM]    Clinical Course User Index [GM] Jenilyn Magana, Sharyon Medicus, PA-C     Final Clinical Impressions(s) / ED Diagnoses  1. Right Testicle Pain. UA suggestive of UTI and will treat as such. Levaquin selected for appropriate GU coverage for UTI and epididymitis. Received prophylactic treatment for gonorrhea/chlamydia. Education provided on s/s that would warrant soon medical follow-up including  abdominal pain, fever and urinary/bowel changes.  Dispo: Home. After thorough clinical evaluation, this patient is determined to be medically stable and can be safely discharged with the previously mentioned treatment and/or outpatient follow-up/referral(s). At this time, there are no other apparent medical conditions that require further screening, evaluation or treatment.  Final diagnoses:  Testicle pain    ED Discharge Orders         Ordered    levofloxacin (LEVAQUIN) 500 MG tablet  Daily     11/12/17 [address]1609            Satina Jerrell, Dunn LoringGabrielle I, PA-C 11/12/17 1629    Tilden Fossaees, Elizabeth, MD 11/13/17 [phone]

## 2017-11-12 NOTE — ED Notes (Signed)
Pt came back to registration after documentation of no answer. States he left to get something to eat.

## 2017-11-13 LAB — URINE CULTURE: Culture: NO GROWTH

## 2017-11-15 LAB — GC/CHLAMYDIA PROBE AMP (~~LOC~~) NOT AT ARMC
Chlamydia: NEGATIVE
Neisseria Gonorrhea: NEGATIVE

## 2018-06-18 ENCOUNTER — Emergency Department (HOSPITAL_BASED_OUTPATIENT_CLINIC_OR_DEPARTMENT_OTHER)
Admission: EM | Admit: 2018-06-18 | Discharge: 2018-06-18 | Disposition: A | Discharge status: Home / Self Care | Payer: Medicaid Other | Attending: Emergency Medicine | Admitting: Emergency Medicine

## 2018-06-18 ENCOUNTER — Other Ambulatory Visit: Payer: Self-pay

## 2018-06-18 ENCOUNTER — Encounter (HOSPITAL_BASED_OUTPATIENT_CLINIC_OR_DEPARTMENT_OTHER): Payer: Self-pay | Admitting: Adult Health

## 2018-06-18 DIAGNOSIS — Y998 Other external cause status: Secondary | ICD-10-CM | POA: Diagnosis not present

## 2018-06-18 DIAGNOSIS — W268XXA Contact with other sharp object(s), not elsewhere classified, initial encounter: Secondary | ICD-10-CM | POA: Diagnosis not present

## 2018-06-18 DIAGNOSIS — S0502XA Injury of conjunctiva and corneal abrasion without foreign body, left eye, initial encounter: Secondary | ICD-10-CM

## 2018-06-18 DIAGNOSIS — Y92017 Garden or yard in single-family (private) house as the place of occurrence of the external cause: Secondary | ICD-10-CM | POA: Insufficient documentation

## 2018-06-18 DIAGNOSIS — S0592XA Unspecified injury of left eye and orbit, initial encounter: Secondary | ICD-10-CM | POA: Diagnosis present

## 2018-06-18 DIAGNOSIS — F17210 Nicotine dependence, cigarettes, uncomplicated: Secondary | ICD-10-CM | POA: Diagnosis not present

## 2018-06-18 DIAGNOSIS — Y93H2 Activity, gardening and landscaping: Secondary | ICD-10-CM | POA: Diagnosis not present

## 2018-06-18 MED ORDER — ERYTHROMYCIN 5 MG/GM OP OINT
TOPICAL_OINTMENT | Freq: Once | OPHTHALMIC | Status: AC
  Administered 2018-06-18: 1 via OPHTHALMIC
  Filled 2018-06-18: qty 3.5

## 2018-06-18 MED ORDER — FLUORESCEIN SODIUM 1 MG OP STRP
1.0000 | ORAL_STRIP | Freq: Once | OPHTHALMIC | Status: DC
  Filled 2018-06-18: qty 1

## 2018-06-18 MED ORDER — TETRACAINE HCL 0.5 % OP SOLN
2.0000 [drp] | Freq: Once | OPHTHALMIC | Status: AC
  Administered 2018-06-18: 2 [drp] via OPHTHALMIC
  Filled 2018-06-18: qty 4

## 2018-06-18 NOTE — ED Provider Notes (Signed)
[facility] EMERGENCY DEPARTMENT Provider Note   CSN: [id number] Arrival date & time: 06/18/18  1834    History   Chief Complaint Chief Complaint  Patient presents with  . Eye Injury    HPI Erik [name] is a 53 y.o. male with past medical history of hepatitis C, presenting to the emergency department with acute onset of left eye pain that began a few hours prior to arrival.  Patient states he was mowing the lawn and a stick from a tree poked him in the eye.  He had immediate pain with foreign body sensation since that time.  Tetracaine eyedrops provided in triage provided relief of symptoms.  Denies vision changes or purulent drainage.  He is not a contact lens wear.  Tetanus is up-to-date.     The history is provided by the patient.    Past Medical History:  Diagnosis Date  . Chronic back pain   . Depression   . Hepatitis C     Patient Active Problem List   Diagnosis Date Noted  . Hepatitis-C 09/24/2013  . S/P laparoscopic partial cholecystectomy with removal of impacted stone and placement of a drain 09/22/2013    Past Surgical History:  Procedure Laterality Date  . BACK SURGERY    . CHOLECYSTECTOMY N/A 09/22/2013   Procedure: LAPAROSCOPIC partial CHOLECYSTECTOMY;  Surgeon: Valarie Merino, MD;  Location: [facility];  Service: General;  Laterality: N/A;  . SALIVARY GLAND SURGERY    . SHOULDER SURGERY          Home Medications    Prior to Admission medications   Medication Sig Start Date End Date Taking? Authorizing Provider  citalopram (CELEXA) 20 MG tablet Take 20 mg by mouth daily.    [provider]  levofloxacin (LEVAQUIN) 500 MG tablet Take 1 tablet (500 mg total) by mouth daily. 11/12/17   Mortis, Jerrel Ivory I, PA-C  ranitidine (ZANTAC) 150 MG tablet Take 150 mg by mouth 2 (two) times daily.    [provider]    Family History History reviewed. No pertinent family history.  Social History Social History   Tobacco Use  .  Smoking status: Current Some Day Smoker    Packs/day: 1.00    Types: Cigarettes  . Smokeless tobacco: Never Used  Substance Use Topics  . Alcohol use: Not Currently    Comment: 2 beers weekly  . Drug use: Not Currently     Allergies   Patient has no known allergies.   Review of Systems Review of Systems  Eyes: Positive for pain and redness. Negative for visual disturbance.  Neurological: Negative for headaches.     Physical Exam Updated Vital Signs BP 116/87 (BP Location: Right Arm)   Pulse 64   Temp 98.3 F (36.8 C) (Oral)   Resp 18   Ht 6' (1.829 m)   Wt (!) 140.6 kg   SpO2 98%   BMI 42.04 kg/m   Physical Exam Vitals signs and nursing note reviewed.  Constitutional:      Appearance: He is well-developed.     Comments: Pt appears very uncomfortable.  HENT:     Head: Normocephalic and atraumatic.  Eyes:     General: Lids are everted, no foreign bodies appreciated. Vision grossly intact. No visual field deficit.       Left eye: No foreign body.     Extraocular Movements: Extraocular movements intact.     Conjunctiva/sclera:     Left eye: Left conjunctiva is injected.  Comments: Linear uptake noted under Woods lamp with fluorescein stain to the left eye.  This uptake is mostly overlying the iris, and seems to mostly spare the field-of-view.  No Seidel sign.  No obvious foreign body.  Cardiovascular:     Rate and Rhythm: Normal rate.  Pulmonary:     Effort: Pulmonary effort is normal.  Neurological:     Mental Status: He is alert.  Psychiatric:        Mood and Affect: Mood normal.        Behavior: Behavior normal.      ED Treatments / Results  Labs (all labs ordered are listed, but only abnormal results are displayed) Labs Reviewed - No data to display  EKG None  Radiology No results found.  Procedures Procedures (including critical care time)  Medications Ordered in ED Medications  fluorescein ophthalmic strip 1 strip (has no  administration in time range)  erythromycin ophthalmic ointment (has no administration in time range)  tetracaine (PONTOCAINE) 0.5 % ophthalmic solution 2 drop (2 drops Left Eye Given 06/18/18 1851)     Initial Impression / Assessment and Plan / ED Course  I have reviewed the triage vital signs and the nursing notes.  Pertinent labs & imaging results that were available during my care of the patient were reviewed by me and considered in my medical decision making (see chart for details).        Pt with corneal abrasion after a stick poked him in the eye today. Tdap up to date. No evidence of FB, no seidel sign.  No change in vision, acuity equal bilaterally.  Pt is not a contact lens wearer.  Exam non-concerning for orbital cellulitis, hyphema, corneal ulcers. Patient will be discharged home with erythromycin.   Patient understands to follow up with ophthalmology, & to return to ER if new symptoms develop including change in vision, purulent drainage, or entrapment.  Discussed results, findings, treatment and follow up. Patient advised of return precautions. Patient verbalized understanding and agreed with plan.  Final Clinical Impressions(s) / ED Diagnoses   Final diagnoses:  Abrasion of left cornea, initial encounter    ED Discharge Orders    None       , [place] N, PA-C 06/18/18 2305    Rolan Bucco, MD 06/18/18 2319

## 2018-06-18 NOTE — ED Triage Notes (Signed)
Presents with redness, pain and tearing to left eye associated with photophobia. EOM intact. PT was doing yard work and was hit in the eye with a stick. No FB visualized.

## 2018-06-18 NOTE — Discharge Instructions (Addendum)
Apply cool compresses to your left eye for symptom relief. Apply the antibiotic ointment, 1/4 inch ribbon, 4 times daily for 5 days to prevent infection. Call the ophthalmology office on Monday to schedule a close follow-up appointment. Return to the emergency department if you develop purulent drainage, loss of vision, or new or worsening symptoms.

## 2018-08-16 ENCOUNTER — Encounter (HOSPITAL_BASED_OUTPATIENT_CLINIC_OR_DEPARTMENT_OTHER): Payer: Self-pay

## 2018-08-16 ENCOUNTER — Other Ambulatory Visit: Payer: Self-pay

## 2018-08-16 ENCOUNTER — Emergency Department (HOSPITAL_BASED_OUTPATIENT_CLINIC_OR_DEPARTMENT_OTHER): Payer: Medicaid Other

## 2018-08-16 ENCOUNTER — Emergency Department (HOSPITAL_BASED_OUTPATIENT_CLINIC_OR_DEPARTMENT_OTHER)
Admission: EM | Admit: 2018-08-16 | Discharge: 2018-08-16 | Disposition: A | Discharge status: Home / Self Care | Payer: Medicaid Other | Attending: Emergency Medicine | Admitting: Emergency Medicine

## 2018-08-16 DIAGNOSIS — F17210 Nicotine dependence, cigarettes, uncomplicated: Secondary | ICD-10-CM | POA: Diagnosis not present

## 2018-08-16 DIAGNOSIS — Z79899 Other long term (current) drug therapy: Secondary | ICD-10-CM | POA: Insufficient documentation

## 2018-08-16 DIAGNOSIS — S065X0D Traumatic subdural hemorrhage without loss of consciousness, subsequent encounter: Secondary | ICD-10-CM

## 2018-08-16 DIAGNOSIS — R0781 Pleurodynia: Secondary | ICD-10-CM | POA: Diagnosis not present

## 2018-08-16 DIAGNOSIS — R42 Dizziness and giddiness: Secondary | ICD-10-CM

## 2018-08-16 HISTORY — DX: Opioid abuse, uncomplicated: F11.10

## 2018-08-16 MED ORDER — ONDANSETRON HCL 8 MG PO TABS
4.0000 mg | ORAL_TABLET | Freq: Once | ORAL | Status: AC
  Administered 2018-08-16: 4 mg via ORAL
  Filled 2018-08-16: qty 1

## 2018-08-16 MED ORDER — ONDANSETRON 4 MG PO TBDP: 4.0000 mg | ORAL_TABLET | Freq: Three times a day (TID) | ORAL | 0 refills | Status: AC | PRN

## 2018-08-16 NOTE — ED Notes (Signed)
Pt transported to radiology.

## 2018-08-16 NOTE — Discharge Instructions (Signed)
Evaluated today for dizziness and Nausea and vomiting. Likely related to post concussion syndrome. Will dc home with Zofran for nausea and emesis.   Follow up with post concussive clinic. If worsening symptoms please seek reevaluation.
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
## 2018-08-16 NOTE — ED Notes (Signed)
Pt sitting up on edge of bed watching tv, no active vomiting. Still attempting to get pt's records from hospital in [place]. Updated pt to plan of care.

## 2018-08-16 NOTE — ED Notes (Addendum)
Pt was admitted to a hospital in [place], not [place]. Calling the hospital to get records, [facility]
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
## 2018-09-11 ENCOUNTER — Other Ambulatory Visit: Payer: Self-pay

## 2018-09-11 ENCOUNTER — Emergency Department (HOSPITAL_BASED_OUTPATIENT_CLINIC_OR_DEPARTMENT_OTHER)
Admission: EM | Admit: 2018-09-11 | Discharge: 2018-09-11 | Disposition: A | Discharge status: Home / Self Care | Payer: Medicaid Other | Attending: Emergency Medicine | Admitting: Emergency Medicine

## 2018-09-11 ENCOUNTER — Encounter (HOSPITAL_BASED_OUTPATIENT_CLINIC_OR_DEPARTMENT_OTHER): Payer: Self-pay | Admitting: Emergency Medicine

## 2018-09-11 DIAGNOSIS — M79604 Pain in right leg: Secondary | ICD-10-CM | POA: Diagnosis present

## 2018-09-11 DIAGNOSIS — L03115 Cellulitis of right lower limb: Secondary | ICD-10-CM | POA: Diagnosis not present

## 2018-09-11 DIAGNOSIS — F17210 Nicotine dependence, cigarettes, uncomplicated: Secondary | ICD-10-CM | POA: Insufficient documentation

## 2018-09-11 MED ORDER — CLINDAMYCIN HCL 150 MG PO CAPS: 450.0000 mg | ORAL_CAPSULE | Freq: Three times a day (TID) | ORAL | 0 refills | Status: AC

## 2018-09-11 MED ORDER — NAPROXEN 500 MG PO TABS: 500.0000 mg | ORAL_TABLET | Freq: Two times a day (BID) | ORAL | 0 refills | Status: AC

## 2018-09-11 NOTE — Discharge Instructions (Signed)
You were seen in the emergency department today for leg pain/swelling/redness.  We suspect you have cellulitis, and infection of the skin of the leg, please see the attached handout for further information.  We are starting you on clindamycin, an antibiotic, please take this 3 times per day as prescribed.  We are sending her with naproxen.with pain/swelling  - Naproxen is a nonsteroidal anti-inflammatory medication that will help with pain and swelling. Be sure to take this medication as prescribed with food, 1 pill every 12 hours,  It should be taken with food, as it can cause stomach upset, and more seriously, stomach bleeding. Do not take other nonsteroidal anti-inflammatory medications with this such as Advil, Motrin, Aleve, Mobic, Goodie Powder, or Motrin.    We have prescribed you new medication(s) today. Discuss the medications prescribed today with your pharmacist as they can have adverse effects and interactions with your other medicines including over the counter and prescribed medications. Seek medical evaluation if you start to experience new or abnormal symptoms after taking one of these medicines, seek care immediately if you start to experience difficulty breathing, feeling of your throat closing, facial swelling, or rash as these could be indications of a more serious allergic reaction  We would like you to follow-up very closely with a primary care provider within 1 to 3 days.  Return to the emergency department immediately for new or worsening symptoms including but not limited to spreading redness, worsening pain, fever, or any other concerns.

## 2018-09-11 NOTE — ED Triage Notes (Signed)
Painful, reddened area to R inner thigh for several days.

## 2018-09-11 NOTE — ED Provider Notes (Signed)
[facility] EMERGENCY DEPARTMENT Provider Note   CSN: [id number] Arrival date & time: 09/11/18  1217   History   Chief Complaint Chief Complaint  Patient presents with  . Leg Pain    HPI Erik [name] is a 53 y.o. male with a hx of tobacco abuse, hepatitis C, & chronic opiate use on suboxone who presents to the ED w/ complaints of R inner thigh pain, redness, and swelling x 1 week. Patient notes the area started as a painful red swollen area that was fairly well localized which has progressively worsened. Pain is a 7/10 in severity, somewhat alleviated w/ goodie powder. Denies break in the skin or direct trauma to the area. Denies fever, chills, nausea, vomiting, numbness, tingling, or weakness. Denies hx of prior PE/DVT or cancer.      HPI  Past Medical History:  Diagnosis Date  . Chronic back pain   . Depression   . Hepatitis C   . Opiate abuse, continuous [facility](HCC)     Patient Active Problem List   Diagnosis Date Noted  . Hepatitis-C 09/24/2013  . S/P laparoscopic partial cholecystectomy with removal of impacted stone and placement of a drain 09/22/2013    Past Surgical History:  Procedure Laterality Date  . BACK SURGERY    . CHOLECYSTECTOMY N/A 09/22/2013   Procedure: LAPAROSCOPIC partial CHOLECYSTECTOMY;  Surgeon: Valarie MerinoMatthew B Martin, MD;  Location: [facility];  Service: General;  Laterality: N/A;  . SALIVARY GLAND SURGERY    . SHOULDER SURGERY        Home Medications    Prior to Admission medications   Medication Sig Start Date End Date Taking? Authorizing Provider  citalopram (CELEXA) 20 MG tablet Take 20 mg by mouth daily.    [provider]  gabapentin (NEURONTIN) 300 MG capsule TK 3 CS PO Q MEAL AND 3 QHS 06/02/18   [provider]  levofloxacin (LEVAQUIN) 500 MG tablet Take 1 tablet (500 mg total) by mouth daily. 11/12/17   Mortis, Jerrel IvoryGabrielle I, PA-C  ondansetron (ZOFRAN ODT) 4 MG disintegrating tablet Take 1 tablet (4 mg total) by mouth every  8 (eight) hours as needed for nausea or vomiting. 08/16/18   Henderly, Britni A, PA-C  ranitidine (ZANTAC) 150 MG tablet Take 150 mg by mouth 2 (two) times daily.    [provider]  SUBOXONE 12-3 MG FILM DISSOLVE 1 FILM PO Q 12 H 06/17/18   [provider]  SUBOXONE 8-2 MG FILM PLACE 1 FILM UNDER THE TONGUE Q 12 HOURS X 7 DAYS 04/14/18   [provider]    Family History No family history on file.  Social History Social History   Tobacco Use  . Smoking status: Current Some Day Smoker    Packs/day: 1.00    Types: Cigarettes  . Smokeless tobacco: Never Used  Substance Use Topics  . Alcohol use: Not Currently  . Drug use: Yes    Types: Marijuana     Allergies   Patient has no known allergies.   Review of Systems Review of Systems  Constitutional: Negative for chills and fever.  Respiratory: Negative for shortness of breath.   Cardiovascular: Positive for leg swelling. Negative for chest pain.  Gastrointestinal: Negative for nausea and vomiting.  Genitourinary: Negative for scrotal swelling and testicular pain.  Musculoskeletal: Positive for myalgias.  Skin: Positive for color change.  Neurological: Negative for weakness and numbness.  All other systems reviewed and are negative.    Physical Exam Updated Vital  Signs BP 126/78 (BP Location: Right Arm)   Pulse 99   Temp 99.3 F (37.4 C) (Oral)   Resp 20   Ht 6' (1.829 m)   Wt 136.1 kg   SpO2 98%   BMI 40.69 kg/m   Physical Exam Vitals signs and nursing note reviewed.  Constitutional:      General: He is not in acute distress.    Appearance: He is not ill-appearing or toxic-appearing.  HENT:     Head: Normocephalic and atraumatic.  Cardiovascular:     Rate and Rhythm: Normal rate.     Pulses:          Dorsalis pedis pulses are 2+ on the right side and 2+ on the left side.       Posterior tibial pulses are 2+ on the right side and 2+ on the left side.  Pulmonary:     Effort:  Pulmonary effort is normal.  Musculoskeletal:     Comments: Lower extremities: R inner thigh: There is a 30 x 20 cm area of erythema as pictured below. The area is warm to the touch. Central induration present. Tender to palpation. Skin changes do not extend to the testicles/perineum. No palpable fluctuance or gross abscess. Patient has intact AROM to bilateral hips, knees, ankles, and all digits. NVI distally. LEs are otherwise nontender. No calf tenderness. Compartments are soft.   Skin:    General: Skin is warm and dry.     Capillary Refill: Capillary refill takes less than 2 seconds.  Neurological:     Mental Status: He is alert.     Comments: Alert. Clear speech. Sensation grossly intact to bilateral lower extremities. 5/5 strength with plantar/dorsiflexion bilaterally. Patient ambulatory.   Psychiatric:        Mood and Affect: Mood normal.        Behavior: Behavior normal.       ED Treatments / Results  Labs (all labs ordered are listed, but only abnormal results are displayed) Labs Reviewed - No data to display  EKG None  Radiology No results found.  Procedures Procedures (including critical care time)  Medications Ordered in ED Medications - No data to display   Initial Impression / Assessment and Plan / ED Course  I have reviewed the triage vital signs and the nursing notes.  Pertinent labs & imaging results that were available during my care of the patient were reviewed by me and considered in my medical decision making (see chart for details).   Patient presents to the ED w/ RLE pain/swelling/redness. Nontoxic appearing, no apparent distress, vitals WNL. Exam seems consistent w/ cellulitis. No palpable fluctuance to suggest abscess requiring I&D. Given well localized to the upper thigh DVT seems unlikely. No trauma or point/focal bony tenderness to suggest fx/dislocation. Compartments are soft, do not suspect compartment syndrome. Feel patient is appropriate for  trial of outpatient abx, discussed w/ Dr. Billy Fischer will place on clindamycin, close PCP follow up w/ strict return precautions. Naproxen for pain/swelling- last creatinine on record within normal limits. I discussed treatment plan, need for follow-up, and return precautions with the patient. Provided opportunity for questions, patient confirmed understanding and is in agreement with plan.   Findings and plan of care discussed with supervising physician Dr. Billy Fischer who is in agreement.   Final Clinical Impressions(s) / ED Diagnoses   Final diagnoses:  Cellulitis of right lower extremity    ED Discharge Orders         Ordered  naproxen (NAPROSYN) 500 MG tablet  2 times daily     09/11/18 1431    clindamycin (CLEOCIN) 150 MG capsule  3 times daily     09/11/18 1431           Mylisa Brunson, ParadisSamantha R, PA-C 09/11/18 1458    Alvira MondaySchlossman, Erin, MD 09/13/18 1024

## 2018-09-11 NOTE — ED Notes (Signed)
Skin marked with marking pen around red area to right upper leg

## 2018-12-06 DEATH — deceased
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
